# Patient Record
Sex: Female | Born: 1980 | Race: Black or African American | Hispanic: No | Marital: Single | Smoking: Never smoker
Health system: Southern US, Community
[De-identification: ages and names within clinical notes are randomized; demographics above are authoritative.]

## PROBLEM LIST (undated history)

## (undated) ENCOUNTER — Inpatient Hospital Stay (HOSPITAL_COMMUNITY): Payer: Self-pay

## (undated) DIAGNOSIS — J45909 Unspecified asthma, uncomplicated: Secondary | ICD-10-CM

## (undated) DIAGNOSIS — K589 Irritable bowel syndrome without diarrhea: Secondary | ICD-10-CM

## (undated) HISTORY — PX: FRACTURE SURGERY: SHX138

---

## 2002-09-13 ENCOUNTER — Emergency Department (HOSPITAL_COMMUNITY): Admission: EM | Admit: 2002-09-13 | Discharge: 2002-09-13 | Payer: Self-pay | Admitting: Emergency Medicine

## 2002-09-24 ENCOUNTER — Emergency Department (HOSPITAL_COMMUNITY): Admission: EM | Admit: 2002-09-24 | Discharge: 2002-09-24 | Payer: Self-pay | Admitting: *Deleted

## 2003-05-12 ENCOUNTER — Emergency Department (HOSPITAL_COMMUNITY): Admission: EM | Admit: 2003-05-12 | Discharge: 2003-05-12 | Payer: Self-pay | Admitting: Emergency Medicine

## 2004-08-22 ENCOUNTER — Emergency Department (HOSPITAL_COMMUNITY): Admission: EM | Admit: 2004-08-22 | Discharge: 2004-08-22 | Payer: Self-pay | Admitting: Family Medicine

## 2005-12-12 ENCOUNTER — Emergency Department (HOSPITAL_COMMUNITY): Admission: EM | Admit: 2005-12-12 | Discharge: 2005-12-12 | Payer: Self-pay | Admitting: Emergency Medicine

## 2007-09-08 ENCOUNTER — Inpatient Hospital Stay (HOSPITAL_COMMUNITY): Admission: AD | Admit: 2007-09-08 | Discharge: 2007-09-08 | Payer: Self-pay | Admitting: Obstetrics & Gynecology

## 2007-11-14 ENCOUNTER — Emergency Department (HOSPITAL_COMMUNITY): Admission: EM | Admit: 2007-11-14 | Discharge: 2007-11-14 | Payer: Self-pay | Admitting: Family Medicine

## 2009-02-15 ENCOUNTER — Inpatient Hospital Stay (HOSPITAL_COMMUNITY): Admission: AD | Admit: 2009-02-15 | Discharge: 2009-02-16 | Payer: Self-pay | Admitting: Obstetrics and Gynecology

## 2010-06-06 ENCOUNTER — Emergency Department (HOSPITAL_COMMUNITY): Admission: EM | Admit: 2010-06-06 | Discharge: 2010-04-06 | Payer: Self-pay | Admitting: Emergency Medicine

## 2010-09-11 LAB — RAPID STREP SCREEN (MED CTR MEBANE ONLY): Streptococcus, Group A Screen (Direct): POSITIVE — AB

## 2010-10-05 LAB — URINALYSIS, ROUTINE W REFLEX MICROSCOPIC
Bilirubin Urine: NEGATIVE
Glucose, UA: NEGATIVE mg/dL
Hgb urine dipstick: NEGATIVE
Ketones, ur: 15 mg/dL — AB
Nitrite: NEGATIVE
Protein, ur: NEGATIVE mg/dL
Specific Gravity, Urine: >1.03 — ABNORMAL HIGH (ref 1.005–1.030)
Urobilinogen, UA: 0.2 mg/dL (ref 0.0–1.0)
pH: 6 (ref 5.0–8.0)

## 2010-10-05 LAB — POCT PREGNANCY, URINE: Preg Test, Ur: POSITIVE

## 2010-10-05 LAB — HCG, QUANTITATIVE, PREGNANCY: hCG, Beta Chain, Quant, S: 46393 m[IU]/mL — ABNORMAL HIGH (ref <5)

## 2010-10-05 LAB — ABO/RH: ABO/RH(D): O POS

## 2010-10-15 ENCOUNTER — Emergency Department (HOSPITAL_COMMUNITY)
Admission: EM | Admit: 2010-10-15 | Discharge: 2010-10-15 | Disposition: A | Discharge status: Home / Self Care | Payer: Self-pay | Attending: Emergency Medicine | Admitting: Emergency Medicine

## 2010-10-15 DIAGNOSIS — R21 Rash and other nonspecific skin eruption: Secondary | ICD-10-CM | POA: Insufficient documentation

## 2010-10-15 DIAGNOSIS — J45909 Unspecified asthma, uncomplicated: Secondary | ICD-10-CM | POA: Insufficient documentation

## 2010-10-15 DIAGNOSIS — L299 Pruritus, unspecified: Secondary | ICD-10-CM | POA: Insufficient documentation

## 2010-10-15 DIAGNOSIS — J020 Streptococcal pharyngitis: Secondary | ICD-10-CM | POA: Insufficient documentation

## 2010-10-15 LAB — RAPID STREP SCREEN (MED CTR MEBANE ONLY): Streptococcus, Group A Screen (Direct): POSITIVE — AB

## 2010-12-13 ENCOUNTER — Inpatient Hospital Stay (HOSPITAL_COMMUNITY)
Admission: AD | Admit: 2010-12-13 | Discharge: 2010-12-14 | Disposition: A | Discharge status: Home / Self Care | Payer: Self-pay | Source: Ambulatory Visit | Attending: Obstetrics and Gynecology | Admitting: Obstetrics and Gynecology

## 2010-12-13 DIAGNOSIS — N949 Unspecified condition associated with female genital organs and menstrual cycle: Secondary | ICD-10-CM | POA: Insufficient documentation

## 2010-12-13 DIAGNOSIS — N938 Other specified abnormal uterine and vaginal bleeding: Secondary | ICD-10-CM | POA: Insufficient documentation

## 2010-12-13 DIAGNOSIS — D259 Leiomyoma of uterus, unspecified: Secondary | ICD-10-CM | POA: Insufficient documentation

## 2010-12-13 LAB — CBC
HCT: 35.3 % — ABNORMAL LOW (ref 36.0–46.0)
Hemoglobin: 11.3 g/dL — ABNORMAL LOW (ref 12.0–15.0)
MCH: 25.8 pg — ABNORMAL LOW (ref 26.0–34.0)
MCHC: 32 g/dL (ref 30.0–36.0)
MCV: 80.6 fL (ref 78.0–100.0)
Platelets: 268 10*3/uL (ref 150–400)
RBC: 4.38 MIL/uL (ref 3.87–5.11)
RDW: 15.5 % (ref 11.5–15.5)
WBC: 12.3 10*3/uL — ABNORMAL HIGH (ref 4.0–10.5)

## 2010-12-14 ENCOUNTER — Inpatient Hospital Stay (HOSPITAL_COMMUNITY): Payer: Self-pay

## 2010-12-14 LAB — URINALYSIS, ROUTINE W REFLEX MICROSCOPIC
Bilirubin Urine: NEGATIVE
Glucose, UA: NEGATIVE mg/dL
Hgb urine dipstick: NEGATIVE
Ketones, ur: NEGATIVE mg/dL
Leukocytes, UA: NEGATIVE
Nitrite: NEGATIVE
Protein, ur: NEGATIVE mg/dL
Specific Gravity, Urine: >1.03 — ABNORMAL HIGH (ref 1.005–1.030)
Urobilinogen, UA: 0.2 mg/dL (ref 0.0–1.0)
pH: 5.5 (ref 5.0–8.0)

## 2010-12-14 LAB — WET PREP, GENITAL
Clue Cells Wet Prep HPF POC: NONE SEEN
Trich, Wet Prep: NONE SEEN
WBC, Wet Prep HPF POC: NONE SEEN
Yeast Wet Prep HPF POC: NONE SEEN

## 2010-12-14 LAB — POCT PREGNANCY, URINE: Preg Test, Ur: NEGATIVE

## 2010-12-14 LAB — GC/CHLAMYDIA PROBE AMP, GENITAL
Chlamydia, DNA Probe: NEGATIVE
GC Probe Amp, Genital: NEGATIVE

## 2011-01-24 ENCOUNTER — Ambulatory Visit: Payer: Self-pay | Admitting: Occupational Therapy

## 2011-01-24 ENCOUNTER — Ambulatory Visit: Payer: Self-pay | Admitting: Advanced Practice Midwife

## 2011-03-24 LAB — URINALYSIS, ROUTINE W REFLEX MICROSCOPIC
Bilirubin Urine: NEGATIVE
Glucose, UA: NEGATIVE
Hgb urine dipstick: NEGATIVE
Ketones, ur: NEGATIVE
Nitrite: NEGATIVE
Protein, ur: NEGATIVE
Specific Gravity, Urine: 1.02
Urobilinogen, UA: 0.2
pH: 5.5

## 2011-03-24 LAB — WET PREP, GENITAL
Trich, Wet Prep: NONE SEEN
Yeast Wet Prep HPF POC: NONE SEEN

## 2011-03-24 LAB — GC/CHLAMYDIA PROBE AMP, GENITAL
Chlamydia, DNA Probe: NEGATIVE
GC Probe Amp, Genital: NEGATIVE

## 2011-03-24 LAB — POCT PREGNANCY, URINE
Operator id: 22373
Preg Test, Ur: NEGATIVE

## 2011-03-26 LAB — POCT PREGNANCY, URINE
Operator id: 282151
Preg Test, Ur: NEGATIVE

## 2011-11-29 ENCOUNTER — Encounter (HOSPITAL_COMMUNITY): Payer: Self-pay | Admitting: *Deleted

## 2011-11-29 ENCOUNTER — Emergency Department (HOSPITAL_COMMUNITY)
Admission: EM | Admit: 2011-11-29 | Discharge: 2011-11-29 | Disposition: A | Discharge status: Home / Self Care | Payer: Self-pay | Attending: Emergency Medicine | Admitting: Emergency Medicine

## 2011-11-29 DIAGNOSIS — R112 Nausea with vomiting, unspecified: Secondary | ICD-10-CM | POA: Insufficient documentation

## 2011-11-29 DIAGNOSIS — R42 Dizziness and giddiness: Secondary | ICD-10-CM | POA: Insufficient documentation

## 2011-11-29 DIAGNOSIS — R51 Headache: Secondary | ICD-10-CM | POA: Insufficient documentation

## 2011-11-29 LAB — URINE MICROSCOPIC-ADD ON

## 2011-11-29 LAB — URINALYSIS, ROUTINE W REFLEX MICROSCOPIC
Nitrite: NEGATIVE
Specific Gravity, Urine: 1.034 — ABNORMAL HIGH (ref 1.005–1.030)
Urobilinogen, UA: 0.2 mg/dL (ref 0.0–1.0)
pH: 6 (ref 5.0–8.0)

## 2011-11-29 LAB — POCT PREGNANCY, URINE: Preg Test, Ur: NEGATIVE

## 2011-11-29 MED ORDER — MECLIZINE HCL 50 MG PO TABS: 50.0000 mg | ORAL_TABLET | Freq: Three times a day (TID) | ORAL | Status: AC | PRN

## 2011-11-29 MED ORDER — MECLIZINE HCL 25 MG PO TABS
50.0000 mg | ORAL_TABLET | Freq: Once | ORAL | Status: AC
  Administered 2011-11-29: 50 mg via ORAL
  Filled 2011-11-29 (×2): qty 1

## 2011-11-29 NOTE — ED Notes (Signed)
Pt from home with reports of intermittent headache, dizziness, nausea and vomiting (caused by dizziness per pt) for 2-3 days. Pt reports an episode or dizziness upon standing about a month ago but subsided.

## 2011-11-29 NOTE — ED Provider Notes (Signed)
History     CSN: [id number]  Arrival date & time 11/29/11  1454   First MD Initiated Contact with Patient 11/29/11 1522      Chief Complaint  Patient presents with  . Dizziness  . Headache  . Nausea  . Emesis    (Consider location/radiation/quality/duration/timing/severity/associated sxs/prior treatment) Patient is a 31 y.o. female presenting with headaches and vomiting. The history is provided by the patient.  Headache  Associated symptoms include vomiting.  Emesis  Associated symptoms include headaches.   patient here with dizziness with emesis x1. Symptoms worse with sudden movements of her head. Does note recent sinus pain and drainage from seasonal allergies. Denies any ataxia or peripheral weakness. No diplopia. No prior history of same. No medications taken for this prior to arrival. Denies any neck pain or palpitations.  History reviewed. No pertinent past medical history.  Past Surgical History  Procedure Date  . Fracture surgery     History reviewed. No pertinent family history.  History  Substance Use Topics  . Smoking status: Never Smoker   . Smokeless tobacco: Never Used  . Alcohol Use: Yes     occ    OB History    Grav Para Term Preterm Abortions TAB SAB Ect Mult Living                  Review of Systems  Gastrointestinal: Positive for vomiting.  Neurological: Positive for headaches.  All other systems reviewed and are negative.    Allergies  Amoxicillin  Home Medications   Current Outpatient Rx  Name Route Sig Dispense Refill  . IBUPROFEN 200 MG PO TABS Oral Take 200 mg by mouth every 6 (six) hours as needed. Pain      BP 136/83  Pulse 72  Temp(Src) 98.5 F (36.9 C) (Oral)  Resp 19  Wt 234 lb (106.142 kg)  SpO2 100%  LMP 11/28/2011  Physical Exam  Nursing note and vitals reviewed. Constitutional: She is oriented to person, place, and time. She appears well-developed and well-nourished.  Non-toxic appearance. No distress.    HENT:  Head: Normocephalic and atraumatic.  Eyes: Conjunctivae, EOM and lids are normal. Pupils are equal, round, and reactive to light.  Neck: Normal range of motion. Neck supple. No tracheal deviation present. No mass present.  Cardiovascular: Normal rate, regular rhythm and normal heart sounds.  Exam reveals no gallop.   No murmur heard. Pulmonary/Chest: Effort normal and breath sounds normal. No stridor. No respiratory distress. She has no decreased breath sounds. She has no wheezes. She has no rhonchi. She has no rales.  Abdominal: Soft. Normal appearance and bowel sounds are normal. She exhibits no distension. There is no tenderness. There is no rebound and no CVA tenderness.  Musculoskeletal: Normal range of motion. She exhibits no edema and no tenderness.  Neurological: She is alert and oriented to person, place, and time. She has normal strength. No cranial nerve deficit or sensory deficit. She displays a negative Romberg sign. Coordination normal. GCS eye subscore is 4. GCS verbal subscore is 5. GCS motor subscore is 6.  Skin: Skin is warm and dry. No abrasion and no rash noted.  Psychiatric: She has a normal mood and affect. Her speech is normal and behavior is normal.    ED Course  Procedures (including critical care time)   Labs Reviewed  URINALYSIS, ROUTINE W REFLEX MICROSCOPIC   No results found.   No diagnosis found.    MDM  Pt given antivert and  feels better--suspect bpv and not central vertigo--stable for d/c        Toy Baker, MD 11/29/11 [phone]

## 2011-11-29 NOTE — Discharge Instructions (Signed)
Benign Positional Vertigo Vertigo means you feel like you or your surroundings are moving when they are not. Benign positional vertigo is the most common form of vertigo. Benign means that the cause of your condition is not serious. Benign positional vertigo is more common in older adults. CAUSES  Benign positional vertigo is the result of an upset in the labyrinth system. This is an area in the middle ear that helps control your balance. This may be caused by a viral infection, head injury, or repetitive motion. However, often no specific cause is found. SYMPTOMS  Symptoms of benign positional vertigo occur when you move your head or eyes in different directions. Some of the symptoms may include:  Loss of balance and falls.   Vomiting.   Blurred vision.   Dizziness.   Nausea.   Involuntary eye movements (nystagmus).  DIAGNOSIS  Benign positional vertigo is usually diagnosed by physical exam. If the specific cause of your benign positional vertigo is unknown, your caregiver may perform imaging tests, such as magnetic resonance imaging (MRI) or computed tomography (CT). TREATMENT  Your caregiver may recommend movements or procedures to correct the benign positional vertigo. Medicines such as meclizine, benzodiazepines, and medicines for nausea may be used to treat your symptoms. In rare cases, if your symptoms are caused by certain conditions that affect the inner ear, you may need surgery. HOME CARE INSTRUCTIONS   Follow your caregiver's instructions.   Move slowly. Do not make sudden body or head movements.   Avoid driving.   Avoid operating heavy machinery.   Avoid performing any tasks that would be dangerous to you or others during a vertigo episode.   Drink enough fluids to keep your urine clear or pale yellow.  SEEK IMMEDIATE MEDICAL CARE IF:   You develop problems with walking, weakness, numbness, or using your arms, hands, or legs.   You have difficulty speaking.   You  develop severe headaches.   Your nausea or vomiting continues or gets worse.   You develop visual changes.   Your family or friends notice any behavioral changes.   Your condition gets worse.   You have a fever.   You develop a stiff neck or sensitivity to light.  MAKE SURE YOU:   Understand these instructions.   Will watch your condition.   Will get help right away if you are not doing well or get worse.  Document Released: 03/24/2006 Document Revised: 06/05/2011 Document Reviewed: 03/06/2011 [employer] Patient Information 2012 [employer].Dizziness Dizziness is a common problem. It is a feeling of unsteadiness or lightheadedness. You may feel like you are about to faint. Dizziness can lead to injury if you stumble or fall. A person of any age group can suffer from dizziness, but dizziness is more common in older adults. CAUSES  Dizziness can be caused by many different things, including:  Middle ear problems.   Standing for too long.   Infections.   An allergic reaction.   Aging.   An emotional response to something, such as the sight of blood.   Side effects of medicines.   Fatigue.   Problems with circulation or blood pressure.   Excess use of alcohol, medicines, or illegal drug use.   Breathing too fast (hyperventilation).   An arrhythmia or problems with your heart rhythm.   Low red blood cell count (anemia).   Pregnancy.   Vomiting, diarrhea, fever, or other illnesses that cause dehydration.   Diseases or conditions such as Parkinson's disease, high blood   pressure (hypertension), diabetes, and thyroid problems.   Exposure to extreme heat.  DIAGNOSIS  To find the cause of your dizziness, your caregiver may do a physical exam, lab tests, radiologic imaging scans, or an electrocardiography test (ECG).  TREATMENT  Treatment of dizziness depends on the cause of your symptoms and can vary greatly. HOME CARE INSTRUCTIONS   Drink enough fluids to keep  your urine clear or pale yellow. This is especially important in very hot weather. In the elderly, it is also important in cold weather.   If your dizziness is caused by medicines, take them exactly as directed. When taking blood pressure medicines, it is especially important to get up slowly.   Rise slowly from chairs and steady yourself until you feel okay.   In the morning, first sit up on the side of the bed. When this seems okay, stand slowly while holding onto something until you know your balance is fine.   If you need to stand in one place for a long time, be sure to move your legs often. Tighten and relax the muscles in your legs while standing.   If dizziness continues to be a problem, have someone stay with you for a day or two. Do this until you feel you are well enough to stay alone. Have the person call your caregiver if he or she notices changes in you that are concerning.   Do not drive or use heavy machinery if you feel dizzy.  SEEK IMMEDIATE MEDICAL CARE IF:   Your dizziness or lightheadedness gets worse.   You feel nauseous or vomit.   You develop problems with talking, walking, weakness, or using your arms, hands, or legs.   You are not thinking clearly or you have difficulty forming sentences. It may take a friend or family member to determine if your thinking is normal.   You develop chest pain, abdominal pain, shortness of breath, or sweating.   Your vision changes.   You notice any bleeding.   You have side effects from medicine that seems to be getting worse rather than better.  MAKE SURE YOU:   Understand these instructions.   Will watch your condition.   Will get help right away if you are not doing well or get worse.  Document Released: 12/10/2000 Document Revised: 06/05/2011 Document Reviewed: 01/03/2011 [employer] Patient Information 2012 [employer]. 

## 2012-02-26 ENCOUNTER — Encounter (HOSPITAL_COMMUNITY): Payer: Self-pay | Admitting: Emergency Medicine

## 2012-02-26 ENCOUNTER — Emergency Department (HOSPITAL_COMMUNITY)
Admission: EM | Admit: 2012-02-26 | Discharge: 2012-02-26 | Disposition: A | Discharge status: Home / Self Care | Payer: Self-pay | Attending: Emergency Medicine | Admitting: Emergency Medicine

## 2012-02-26 DIAGNOSIS — L0291 Cutaneous abscess, unspecified: Secondary | ICD-10-CM

## 2012-02-26 DIAGNOSIS — J45909 Unspecified asthma, uncomplicated: Secondary | ICD-10-CM | POA: Insufficient documentation

## 2012-02-26 DIAGNOSIS — IMO0002 Reserved for concepts with insufficient information to code with codable children: Secondary | ICD-10-CM | POA: Insufficient documentation

## 2012-02-26 HISTORY — DX: Unspecified asthma, uncomplicated: J45.909

## 2012-02-26 MED ORDER — SULFAMETHOXAZOLE-TRIMETHOPRIM 800-160 MG PO TABS: 1.0000 | ORAL_TABLET | Freq: Two times a day (BID) | ORAL | Status: AC

## 2012-02-26 NOTE — ED Notes (Signed)
Pt c/o boil in R axilla area. Pt states she keeps draining it, but keeps coming back.

## 2012-02-26 NOTE — ED Provider Notes (Signed)
History     CSN: [id number]  Arrival date & time 02/26/12  1826   First MD Initiated Contact with Patient 02/26/12 1924      Chief Complaint  Patient presents with  . Skin Abcess     (Consider location/radiation/quality/duration/timing/severity/associated sxs/prior treatment) HPI Comments: Kaitlin Salinas 31 y.o. female   The chief complaint is: Patient presents with:   Skin Abcess    The patient has medical history significant for:   Past Medical History:   Asthma                                                      Patient presents with abscess under her left arm. She states that it has been there for a few months and that she has this problem chronically. She has used tea tree oil and warm compresses to drain it. The area temporarily resolves but then returns. Denies fever or chills. Denies NVD or abdominal pain.     The history is provided by the patient.    Past Medical History  Diagnosis Date  . Asthma     Past Surgical History  Procedure Date  . Fracture surgery     No family history on file.  History  Substance Use Topics  . Smoking status: Never Smoker   . Smokeless tobacco: Never Used  . Alcohol Use: Yes     occ    OB History    Grav Para Term Preterm Abortions TAB SAB Ect Mult Living                  Review of Systems  Constitutional: Negative for fever and chills.  Gastrointestinal: Positive for abdominal pain. Negative for nausea, vomiting and diarrhea.  Skin: Positive for color change.  All other systems reviewed and are negative.    Allergies  Amoxicillin  Home Medications   Current Outpatient Rx  Name Route Sig Dispense Refill  . ALBUTEROL SULFATE HFA 108 (90 BASE) MCG/ACT IN AERS Inhalation Inhale 2 puffs into the lungs every 6 (six) hours as needed. SHORTNESS OF BREATH    . IBUPROFEN 200 MG PO TABS Oral Take 200 mg by mouth every 6 (six) hours as needed. Pain    . TEA TREE OIL EX Apply externally Apply 1 application  topically daily.      BP 140/94  Pulse 79  Temp 98.7 F (37.1 C) (Oral)  Resp 18  SpO2 99%  LMP 01/26/2012  Physical Exam  Nursing note and vitals reviewed. Constitutional: She appears well-developed and well-nourished.  HENT:  Head: Normocephalic and atraumatic.  Mouth/Throat: Oropharynx is clear and moist.  Eyes: Conjunctivae and EOM are normal. No scleral icterus.  Cardiovascular: Normal rate, regular rhythm and normal heart sounds.   Pulmonary/Chest: Effort normal and breath sounds normal.  Abdominal: Soft. Bowel sounds are normal. There is no tenderness.  Neurological: She is alert.  Skin: Skin is warm and dry.       1cm abscess located under right axilla. Visible pus.    ED Course  Procedures (including critical care time)  Labs Reviewed - No data to display No results found. INCISION AND DRAINAGE Performed by: Pixie Casino Consent: Verbal consent obtained. Risks and benefits: risks, benefits and alternatives were discussed Type: abscess  Body area: right axilla  Anesthesia: local infiltration  Local  anesthetic: lidocaine 1%   Anesthetic total: 2 ml  Complexity: complex Blunt dissection to break up loculations  Drainage: purulent  Drainage amount: 4ml  Packing material: 1/4 in iodoform gauze  Patient tolerance: Patient tolerated the procedure well with no immediate complications.     1. Abscess       MDM  Patient presented with abscess of the right axilla. I&D performed successfully without complication. Patient declined pain medication. Patient instructed to remove packing in two days. Discharged on antibiotics with appropriate return precautions. No red flags for cellulitis.        Pixie Casino, PA-C 02/26/12 2315

## 2012-02-27 NOTE — ED Provider Notes (Signed)
Medical screening examination/treatment/procedure(s) were performed by non-physician practitioner and as supervising physician I was immediately available for consultation/collaboration.  Martha K Linker, MD 02/27/12 0035 

## 2014-06-30 DIAGNOSIS — K589 Irritable bowel syndrome without diarrhea: Secondary | ICD-10-CM

## 2014-06-30 HISTORY — DX: Irritable bowel syndrome, unspecified: K58.9

## 2014-09-17 ENCOUNTER — Emergency Department (HOSPITAL_COMMUNITY)
Admission: EM | Admit: 2014-09-17 | Discharge: 2014-09-17 | Disposition: A | Discharge status: Home / Self Care | Payer: 59 | Attending: Emergency Medicine | Admitting: Emergency Medicine

## 2014-09-17 ENCOUNTER — Encounter (HOSPITAL_COMMUNITY): Payer: Self-pay | Admitting: *Deleted

## 2014-09-17 DIAGNOSIS — K029 Dental caries, unspecified: Secondary | ICD-10-CM

## 2014-09-17 DIAGNOSIS — J45909 Unspecified asthma, uncomplicated: Secondary | ICD-10-CM | POA: Diagnosis not present

## 2014-09-17 DIAGNOSIS — Z79899 Other long term (current) drug therapy: Secondary | ICD-10-CM | POA: Diagnosis not present

## 2014-09-17 DIAGNOSIS — Z880 Allergy status to penicillin: Secondary | ICD-10-CM | POA: Diagnosis not present

## 2014-09-17 DIAGNOSIS — K088 Other specified disorders of teeth and supporting structures: Secondary | ICD-10-CM | POA: Diagnosis present

## 2014-09-17 NOTE — ED Notes (Signed)
Patient with c/o tooth pain x 3-4 months Patient states that Motrin provides relief, but pain returns when medication wears off No facial edema noted  Patient able to speak in full, complete sentences without difficulty--handles secretions Patient in NAD

## 2014-09-17 NOTE — Discharge Instructions (Signed)
Dental Pain A tooth ache may be caused by cavities (tooth decay). Cavities expose the nerve of the tooth to air and hot or cold temperatures. It may come from an infection or abscess (also called a boil or furuncle) around your tooth. It is also often caused by dental caries (tooth decay). This causes the pain you are having. DIAGNOSIS  Your caregiver can diagnose this problem by exam. TREATMENT   If caused by an infection, it may be treated with medications which kill germs (antibiotics) and pain medications as prescribed by your caregiver. Take medications as directed.  Only take over-the-counter or prescription medicines for pain, discomfort, or fever as directed by your caregiver.  Whether the tooth ache today is caused by infection or dental disease, you should see your dentist as soon as possible for further care. SEEK MEDICAL CARE IF: The exam and treatment you received today has been provided on an emergency basis only. This is not a substitute for complete medical or dental care. If your problem worsens or new problems (symptoms) appear, and you are unable to meet with your dentist, call or return to this location. SEEK IMMEDIATE MEDICAL CARE IF:   You have a fever.  You develop redness and swelling of your face, jaw, or neck.  You are unable to open your mouth.  You have severe pain uncontrolled by pain medicine. MAKE SURE YOU:   Understand these instructions.  Will watch your condition.  Will get help right away if you are not doing well or get worse. Document Released: 06/16/2005 Document Revised: 09/08/2011 Document Reviewed: 02/02/2008 [facility] Patient Information 2015 [place], [place]. This information is not intended to replace advice given to you by your health care provider. Make sure you discuss any questions you have with your health care provider.  Dental Caries Dental caries is tooth decay. This decay can cause a hole in teeth (cavity) that can get bigger and  deeper over time. HOME CARE  Brush and floss your teeth. Do this at least two times a day.  Use a fluoride toothpaste.  Use a mouth rinse if told by your dentist or doctor.  Eat less sugary and starchy foods. Drink less sugary drinks.  Avoid snacking often on sugary and starchy foods. Avoid sipping often on sugary drinks.  Keep regular checkups and cleanings with your dentist.  Use fluoride supplements if told by your dentist or doctor.  Allow fluoride to be applied to teeth if told by your dentist or doctor. Document Released: 03/25/2008 Document Revised: 10/31/2013 Document Reviewed: 06/18/2012 [facility] Patient Information 2015 [place], [place]. This information is not intended to replace advice given to you by your health care provider. Make sure you discuss any questions you have with your health care provider. Please go to the drugstore and by either dental wax, or temporary filling to place in the cavity.  You've also been given a referral to the dentist on call.  Please call first thing in the morning telling when you were referred through the emergency department.  They will make every effort to see you within a 24-hour window

## 2014-09-17 NOTE — ED Notes (Signed)
Patient asking for clarification r/t dentist referral NP at bedside

## 2014-09-17 NOTE — ED Provider Notes (Signed)
CSN: [id number]     Arrival date & time 09/17/14  2036 History   None    This chart was scribed for non-physician practitioner, Junius Creamer, [place] working with Lajean Saver, MD by Forrestine Him, ED Scribe. This patient was seen in room WTR8/WTR8 and the patient's care was started at 8:55 PM.   Chief Complaint  Patient presents with  . Dental Pain   The history is provided by the patient. No language interpreter was used.    HPI Comments: Kaitlin Salinas is a 34 y.o. female with a PMHx of Asthma who presents to the Emergency Department complaining of denital pain x 2 months ago. Pt states she is unable to eat without any discomfort. Pain is worsened with cold sensations and when eating sweets. She has tried OTC Orajel and Ibuprofren with temporary improvement for symptoms. No recent fever or chills. [name] is not currently followed by a dentist. Pt with known allergy to Amoxicillin.  Past Medical History  Diagnosis Date  . Asthma    Past Surgical History  Procedure Laterality Date  . Fracture surgery     History reviewed. No pertinent family history. History  Substance Use Topics  . Smoking status: Never Smoker   . Smokeless tobacco: Never Used  . Alcohol Use: Yes     Comment: occ   OB History    No data available     Review of Systems  Constitutional: Negative for fever and chills.  HENT: Positive for dental problem.       Allergies  Amoxicillin  Home Medications   Prior to Admission medications   Medication Sig Start Date End Date Taking? Authorizing Provider  albuterol (PROVENTIL HFA;VENTOLIN HFA) 108 (90 BASE) MCG/ACT inhaler Inhale 2 puffs into the lungs every 6 (six) hours as needed. SHORTNESS OF BREATH    Historical Provider, MD  ibuprofen (ADVIL,MOTRIN) 200 MG tablet Take 200 mg by mouth every 6 (six) hours as needed. Pain    Historical Provider, MD  TEA TREE OIL EX Apply 1 application topically daily.    Historical Provider, MD   Triage Vitals: BP 157/97  mmHg  Pulse 62  Temp(Src) 97.8 F (36.6 C) (Oral)  Resp 22  SpO2 100%   Physical Exam  Constitutional: She is oriented to person, place, and time. She appears well-developed and well-nourished.  HENT:  Head: Normocephalic.  Mouth/Throat:    Eyes: EOM are normal.  Neck: Normal range of motion.  Pulmonary/Chest: Effort normal.  Abdominal: She exhibits no distension.  Musculoskeletal: Normal range of motion.  Neurological: She is alert and oriented to person, place, and time.  Psychiatric: She has a normal mood and affect.  Nursing note and vitals reviewed.   ED Course  Procedures (including critical care time)  DIAGNOSTIC STUDIES: Oxygen Saturation is 100% on RA, Normal by my interpretation.    COORDINATION OF CARE: 9:13 PM-Discussed treatment plan with pt at bedside and pt agreed to plan.  a   Labs Review Labs Reviewed - No data to display  Imaging Review No results found.   EKG Interpretation None      MDM   Final diagnoses:  Dental cavity   I personally performed the services described in this documentation, which was scribed in my presence. The recorded information has been reviewed and is accurate.  Junius Creamer, NP 09/17/14 2112  Junius Creamer, NP 09/17/14 2113  Lajean Saver, MD 09/19/14 [phone]

## 2015-01-16 ENCOUNTER — Emergency Department (HOSPITAL_COMMUNITY)
Admission: EM | Admit: 2015-01-16 | Discharge: 2015-01-16 | Disposition: A | Discharge status: Home / Self Care | Payer: 59 | Attending: Emergency Medicine | Admitting: Emergency Medicine

## 2015-01-16 ENCOUNTER — Encounter (HOSPITAL_COMMUNITY): Payer: Self-pay | Admitting: *Deleted

## 2015-01-16 DIAGNOSIS — Z880 Allergy status to penicillin: Secondary | ICD-10-CM | POA: Diagnosis not present

## 2015-01-16 DIAGNOSIS — H109 Unspecified conjunctivitis: Secondary | ICD-10-CM

## 2015-01-16 DIAGNOSIS — Z79899 Other long term (current) drug therapy: Secondary | ICD-10-CM | POA: Insufficient documentation

## 2015-01-16 DIAGNOSIS — J45909 Unspecified asthma, uncomplicated: Secondary | ICD-10-CM | POA: Diagnosis not present

## 2015-01-16 DIAGNOSIS — L02411 Cutaneous abscess of right axilla: Secondary | ICD-10-CM

## 2015-01-16 MED ORDER — ACIDOPHILUS PROBIOTIC 10 MG PO TABS: 10.0000 mg | ORAL_TABLET | Freq: Three times a day (TID) | ORAL | Status: DC

## 2015-01-16 MED ORDER — CLINDAMYCIN HCL 150 MG PO CAPS
300.0000 mg | ORAL_CAPSULE | Freq: Three times a day (TID) | ORAL | Status: DC
Start: 2015-01-16 — End: 2015-05-04

## 2015-01-16 MED ORDER — POLYMYXIN B-TRIMETHOPRIM 10000-0.1 UNIT/ML-% OP SOLN: 1.0000 [drp] | OPHTHALMIC | Status: DC

## 2015-01-16 MED ORDER — LIDOCAINE HCL (PF) 1 % IJ SOLN
5.0000 mL | Freq: Once | INTRAMUSCULAR | Status: AC
  Administered 2015-01-16: 5 mL
  Filled 2015-01-16: qty 5

## 2015-01-16 NOTE — Discharge Instructions (Signed)
Abscess °An abscess is an infected area that contains a collection of pus and debris. It can occur in almost any part of the body. An abscess is also known as a furuncle or boil. °CAUSES  °An abscess occurs when tissue gets infected. This can occur from blockage of oil or sweat glands, infection of hair follicles, or a minor injury to the skin. As the body tries to fight the infection, pus collects in the area and creates pressure under the skin. This pressure causes pain. People with weakened immune systems have difficulty fighting infections and get certain abscesses more often.  °SYMPTOMS °Usually an abscess develops on the skin and becomes a painful mass that is red, warm, and tender. If the abscess forms under the skin, you may feel a moveable soft area under the skin. Some abscesses break open (rupture) on their own, but most will continue to get worse without care. The infection can spread deeper into the body and eventually into the bloodstream, causing you to feel ill.  °DIAGNOSIS  °Your caregiver will take your medical history and perform a physical exam. A sample of fluid may also be taken from the abscess to determine what is causing your infection. °TREATMENT  °Your caregiver may prescribe antibiotic medicines to fight the infection. However, taking antibiotics alone usually does not cure an abscess. Your caregiver may need to make a small cut (incision) in the abscess to drain the pus. In some cases, gauze is packed into the abscess to reduce pain and to continue draining the area. °HOME CARE INSTRUCTIONS  °· Only take over-the-counter or prescription medicines for pain, discomfort, or fever as directed by your caregiver. °· If you were prescribed antibiotics, take them as directed. Finish them even if you start to feel better. °· If gauze is used, follow your caregiver's directions for changing the gauze. °· To avoid spreading the infection: °· Keep your draining abscess covered with a  bandage. °· Wash your hands well. °· Do not share personal care items, towels, or whirlpools with others. °· Avoid skin contact with others. °· Keep your skin and clothes clean around the abscess. °· Keep all follow-up appointments as directed by your caregiver. °SEEK MEDICAL CARE IF:  °· You have increased pain, swelling, redness, fluid drainage, or bleeding. °· You have muscle aches, chills, or a general ill feeling. °· You have a fever. °MAKE SURE YOU:  °· Understand these instructions. °· Will watch your condition. °· Will get help right away if you are not doing well or get worse. °Document Released: 03/26/2005 Document Revised: 12/16/2011 Document Reviewed: 08/29/2011 °Exit[employer]® Patient Information ©2015 [employer]. This information is not intended to replace advice given to you by your health care provider. Make sure you discuss any questions you have with your health care provider. ° °Abscess °Care After °An abscess (also called a boil or furuncle) is an infected area that contains a collection of pus. Signs and symptoms of an abscess include pain, tenderness, redness, or hardness, or you may feel a moveable soft area under your skin. An abscess can occur anywhere in the body. The infection may spread to surrounding tissues causing cellulitis. A cut (incision) by the surgeon was made over your abscess and the pus was drained out. Gauze may have been packed into the space to provide a drain that will allow the cavity to heal from the inside outwards. The boil may be painful for 5 to 7 days. Most people with a boil do not have   high fevers. Your abscess, if seen early, may not have localized, and may not have been lanced. If not, another appointment may be required for this if it does not get better on its own or with medications. HOME CARE INSTRUCTIONS   Only take over-the-counter or prescription medicines for pain, discomfort, or fever as directed by your caregiver.  When you bathe, soak and then  remove gauze or iodoform packs at least daily or as directed by your caregiver. You may then wash the wound gently with mild soapy water. Repack with gauze or do as your caregiver directs. SEEK IMMEDIATE MEDICAL CARE IF:   You develop increased pain, swelling, redness, drainage, or bleeding in the wound site.  You develop signs of generalized infection including muscle aches, chills, fever, or a general ill feeling.  An oral temperature above 102 F (38.9 C) develops, not controlled by medication. See your caregiver for a recheck if you develop any of the symptoms described above. If medications (antibiotics) were prescribed, take them as directed. Document Released: 01/02/2005 Document Revised: 09/08/2011 Document Reviewed: 08/30/2007 [facility] Patient Information 2015 [place], [place]. This information is not intended to replace advice given to you by your health care provider. Make sure you discuss any questions you have with your health care provider.  Bacterial Conjunctivitis Bacterial conjunctivitis (commonly called pink eye) is redness, soreness, or puffiness (inflammation) of the white part of your eye. It is caused by a germ called bacteria. These germs can easily spread from person to person (contagious). Your eye often will become red or pink. Your eye may also become irritated, watery, or have a thick discharge.  HOME CARE   Apply a cool, clean washcloth over closed eyelids. Do this for 10-20 minutes, 3-4 times a day while you have pain.  Gently wipe away any fluid coming from the eye with a warm, wet washcloth or cotton ball.  Wash your hands often with soap and water. Use paper towels to dry your hands.  Do not share towels or washcloths.  Change or wash your pillowcase every day.  Do not use eye makeup until the infection is gone.  Do not use machines or drive if your vision is blurry.  Stop using contact lenses. Do not use them again until your doctor says it is  okay.  Do not touch the tip of the eye drop bottle or medicine tube with your fingers when you put medicine on the eye. GET HELP RIGHT AWAY IF:   Your eye is not better after 3 days of starting your medicine.  You have a yellowish fluid coming out of the eye.  You have more pain in the eye.  Your eye redness is spreading.  Your vision becomes blurry.  You have a fever or lasting symptoms for more than 2-3 days.  You have a fever and your symptoms suddenly get worse.  You have pain in the face.  Your face gets red or puffy (swollen). MAKE SURE YOU:   Understand these instructions.  Will watch this condition.  Will get help right away if you are not doing well or get worse. Document Released: 03/25/2008 Document Revised: 06/02/2012 Document Reviewed: 02/20/2012 [facility] Patient Information 2015 [place], [place]. This information is not intended to replace advice given to you by your health care provider. Make sure you discuss any questions you have with your health care provider.

## 2015-01-16 NOTE — ED Notes (Signed)
Pt reports abscess in her R axilla x 2 weeks, no drainage noted at this time.  Pt also reports L eye redness and soreness since Sunday.

## 2015-01-16 NOTE — ED Provider Notes (Signed)
CSN: [id number]     Arrival date & time 01/16/15  1525 History   This chart was scribed for Kaitlin Pean, PA-C working with No att. providers found by Mercy Moore, ED Scribe. This patient was seen in room WTR7/WTR7 and the patient's care was started at 4:41 PM.   Chief Complaint  Patient presents with  . Abscess  . Eye Pain   The history is provided by the patient. No language interpreter was used.   HPI Comments: Kaitlin Salinas is a 34 y.o. female who presents to the Emergency Department complaining of localized pain, swelling, and warmth to right axilla for two weeks now. Patient reports history of right axillary abscess requiring incision. She denies drainage from the abscess. She denies fevers or chills.   Patient secondarily complains of pain and irritation to her left eyeball and eye redness ongoing for two days now. Patient reports treatment with eye drops which did work to relieve her pain and she feels her symptoms are improved. She noted matting discharge yesterday.  Patient does not wear contact lens or glasses.  Patient denies double vision or change in her vision since onset. She denies sick contacts. She denies foreign body sensation.   Past Medical History  Diagnosis Date  . Asthma    Past Surgical History  Procedure Laterality Date  . Fracture surgery     No family history on file. History  Substance Use Topics  . Smoking status: Never Smoker   . Smokeless tobacco: Never Used  . Alcohol Use: Yes     Comment: occ   OB History    No data available     Review of Systems  Constitutional: Negative for fever and chills.  HENT: Negative for ear pain, facial swelling, sore throat and trouble swallowing.   Eyes: Positive for pain, redness and itching. Negative for photophobia, discharge and visual disturbance.  Respiratory: Negative for cough.   Skin: Positive for color change. Negative for rash.       Abscess       Allergies  Amoxicillin  Home Medications    Prior to Admission medications   Medication Sig Start Date End Date Taking? Authorizing Provider  albuterol (PROVENTIL HFA;VENTOLIN HFA) 108 (90 BASE) MCG/ACT inhaler Inhale 2 puffs into the lungs every 6 (six) hours as needed. SHORTNESS OF BREATH    Historical Provider, MD  clindamycin (CLEOCIN) 150 MG capsule Take 2 capsules (300 mg total) by mouth 3 (three) times daily. May dispense as 150mg  capsules 01/16/15   Kaitlin Pean, PA-C  ibuprofen (ADVIL,MOTRIN) 200 MG tablet Take 200 mg by mouth every 6 (six) hours as needed. Pain    Historical Provider, MD  Lactobacillus (ACIDOPHILUS PROBIOTIC) 10 MG TABS Take 10 mg by mouth 3 (three) times daily. 01/16/15   Kaitlin Pean, PA-C  TEA TREE OIL EX Apply 1 application topically daily.    Historical Provider, MD  trimethoprim-polymyxin b (POLYTRIM) ophthalmic solution Place 1 drop into the left eye every 4 (four) hours. 01/16/15   Kaitlin Pean, PA-C   Triage Vitals: BP 127/66 mmHg  Pulse 89  Temp(Src) 98.7 F (37.1 C) (Oral)  Resp 16  SpO2 100%  LMP 12/25/2014 Physical Exam  Constitutional: She appears well-developed and well-nourished. No distress.  Nontoxic appearing.  HENT:  Head: Normocephalic and atraumatic.  Eyes: EOM are normal. Pupils are equal, round, and reactive to light. Right eye exhibits no discharge. Left eye exhibits no discharge. No scleral icterus.  Left eye: Mild conjunctival injection. No  discharge noted.  Neck: Normal range of motion. Neck supple. No JVD present.  Cardiovascular: Normal rate, regular rhythm and intact distal pulses.   Pulmonary/Chest: Effort normal. No respiratory distress.  Lymphadenopathy:    She has no cervical adenopathy.  Neurological: She is alert. Coordination normal.  Skin: Skin is warm and dry. No rash noted. She is not diaphoretic. There is erythema. No pallor.  Area of induration and fluctuance to her right axilla with overlying erythema. No discharge. No surrounding erythema.    Psychiatric: She has a normal mood and affect. Her behavior is normal.  Nursing note and vitals reviewed.   ED Course  INCISION AND DRAINAGE Date/Time: 01/16/2015 4:10 PM Performed by: Kaitlin Salinas Authorized by: Kaitlin Salinas Consent: Verbal consent obtained. Risks and benefits: risks, benefits and alternatives were discussed Consent given by: patient Patient understanding: patient states understanding of the procedure being performed Patient consent: the patient's understanding of the procedure matches consent given Procedure consent: procedure consent matches procedure scheduled Relevant documents: relevant documents present and verified Site marked: the operative site was marked Imaging studies: imaging studies available Required items: required blood products, implants, devices, and special equipment available Patient identity confirmed: verbally with patient Time out: Immediately prior to procedure a "time out" was called to verify the correct patient, procedure, equipment, support staff and site/side marked as required. Type: abscess Location: right axilla.  Anesthesia: local infiltration Local anesthetic: lidocaine 1% without epinephrine Anesthetic total: 2 ml Patient sedated: no Scalpel size: 11 Incision type: single straight Complexity: simple Drainage: serosanguinous Drainage amount: moderate Wound treatment: wound left open Packing material: none Patient tolerance: Patient tolerated the procedure well with no immediate complications   (including critical care time)  COORDINATION OF CARE: 4:47 PM- Patient agrees to bedside ultrasound to assess need for I&D. Discussed treatment plan with patient at bedside and patient agreed to plan.   Labs Review Labs Reviewed - No data to display  Imaging Review No results found.   EKG Interpretation None     Filed Vitals:   01/16/15 1530 01/16/15 1734  BP: 127/66 120/71  Pulse: 89 85  Temp: 98.7 F (37.1 C)    TempSrc: Oral   Resp: 16 18  SpO2: 100% 100%    EMERGENCY DEPARTMENT US SOFT TISSUE INTERPRETATION "Study: Limited Ultrasound of the noted body part in comments below"  INDICATIONS: Pain Multiple views of the body part are obtained with a multi-frequency linear probe  PERFORMED BY:  Myself  IMAGES ARCHIVED?: Yes  SIDE:Right   BODY PART:Axilla  FINDINGS: Abcess present  LIMITATIONS:  None   INTERPRETATION:  Abcess present  COMMENT:  Abscess without cellulitis present to right axilla.    MDM   Meds given in ED:  Medications  lidocaine (PF) (XYLOCAINE) 1 % injection 5 mL (5 mLs Infiltration Given by Other 01/16/15 1735)    Discharge Medication List as of 01/16/2015  5:26 PM    START taking these medications   Details  clindamycin (CLEOCIN) 150 MG capsule Take 2 capsules (300 mg total) by mouth 3 (three) times daily. May dispense as 150mg  capsules, Starting 01/16/2015, Until Discontinued, Print    Lactobacillus (ACIDOPHILUS PROBIOTIC) 10 MG TABS Take 10 mg by mouth 3 (three) times daily., Starting 01/16/2015, Until Discontinued, Print    trimethoprim-polymyxin b (POLYTRIM) ophthalmic solution Place 1 drop into the left eye every 4 (four) hours., Starting 01/16/2015, Until Discontinued, Print        Final diagnoses:  Abscess of axilla, right  Conjunctivitis  of left eye   This is a 34 year old female with chief complaints. Her first complaint is eye redness and irritation for the past 2 days. She also reports associated eye matting yesterday.On exam the patient has left-sided conjunctival injection. She denies any double vision or blurry vision. Will prescribe antibiotic eyedrops for conjunctivitis. Patient also complaining of an abscess to her right axilla for the past 2 weeks.she reports history of previous abscesses in her left axilla which required incision and drainage. On exam patient is afebrile nontoxic appearing. She has an area of induration and fluctuance to her  right axilla without drainage. There is overlying erythema without surrounding erythema. Ultrasound used to determine if there is an abscess truly present. Soft tissue ultrasound indicated an abscess. Incision and drainage performed by me and tolerated well by the patient. There was a moderate amount of serosanguineous fluid obtained. Patient still has a large amount of induration and overlying erythema. Will discharge with clindamycin, probiotic and eye drops. Wound care instructions given. I advised the patient to follow-up with their primary care provider this week. I advised the patient to return to the emergency department with new or worsening symptoms or new concerns. The patient verbalized understanding and agreement with plan.    I personally performed the services described in this documentation, which was scribed in my presence. The recorded information has been reviewed and is accurate.       Kaitlin Pean, PA-C 01/16/15 1938  Evelina Bucy, MD 01/16/15 [phone]

## 2015-02-05 ENCOUNTER — Other Ambulatory Visit (HOSPITAL_COMMUNITY)
Admission: RE | Admit: 2015-02-05 | Discharge: 2015-02-05 | Disposition: A | Discharge status: Home / Self Care | Payer: 59 | Source: Ambulatory Visit | Attending: Internal Medicine | Admitting: Internal Medicine

## 2015-02-05 ENCOUNTER — Other Ambulatory Visit: Payer: Self-pay | Admitting: Internal Medicine

## 2015-02-05 DIAGNOSIS — Z01411 Encounter for gynecological examination (general) (routine) with abnormal findings: Secondary | ICD-10-CM | POA: Diagnosis not present

## 2015-02-05 DIAGNOSIS — Z1151 Encounter for screening for human papillomavirus (HPV): Secondary | ICD-10-CM | POA: Diagnosis present

## 2015-02-07 LAB — CYTOLOGY - PAP

## 2015-02-16 ENCOUNTER — Encounter (HOSPITAL_COMMUNITY): Payer: Self-pay | Admitting: Emergency Medicine

## 2015-02-16 ENCOUNTER — Emergency Department (HOSPITAL_COMMUNITY)
Admission: EM | Admit: 2015-02-16 | Discharge: 2015-02-17 | Disposition: A | Discharge status: Home / Self Care | Payer: 59 | Attending: Emergency Medicine | Admitting: Emergency Medicine

## 2015-02-16 DIAGNOSIS — Y9289 Other specified places as the place of occurrence of the external cause: Secondary | ICD-10-CM | POA: Diagnosis not present

## 2015-02-16 DIAGNOSIS — S46011A Strain of muscle(s) and tendon(s) of the rotator cuff of right shoulder, initial encounter: Secondary | ICD-10-CM | POA: Diagnosis not present

## 2015-02-16 DIAGNOSIS — Z792 Long term (current) use of antibiotics: Secondary | ICD-10-CM | POA: Insufficient documentation

## 2015-02-16 DIAGNOSIS — S43421A Sprain of right rotator cuff capsule, initial encounter: Secondary | ICD-10-CM | POA: Diagnosis not present

## 2015-02-16 DIAGNOSIS — Y9371 Activity, boxing: Secondary | ICD-10-CM | POA: Diagnosis not present

## 2015-02-16 DIAGNOSIS — Y998 Other external cause status: Secondary | ICD-10-CM | POA: Insufficient documentation

## 2015-02-16 DIAGNOSIS — X58XXXA Exposure to other specified factors, initial encounter: Secondary | ICD-10-CM | POA: Diagnosis not present

## 2015-02-16 DIAGNOSIS — Z880 Allergy status to penicillin: Secondary | ICD-10-CM | POA: Insufficient documentation

## 2015-02-16 DIAGNOSIS — Z79899 Other long term (current) drug therapy: Secondary | ICD-10-CM | POA: Diagnosis not present

## 2015-02-16 DIAGNOSIS — S4991XA Unspecified injury of right shoulder and upper arm, initial encounter: Secondary | ICD-10-CM | POA: Diagnosis present

## 2015-02-16 DIAGNOSIS — J45909 Unspecified asthma, uncomplicated: Secondary | ICD-10-CM | POA: Insufficient documentation

## 2015-02-16 NOTE — ED Notes (Signed)
Pt c/o right shoulder pain, able to move arm independently, no obvious deformity to same. C/o slight numbness in finger tips.

## 2015-02-16 NOTE — ED Notes (Signed)
No answer from lobby  

## 2015-02-17 ENCOUNTER — Emergency Department (HOSPITAL_COMMUNITY): Payer: 59

## 2015-02-17 MED ORDER — CYCLOBENZAPRINE HCL 5 MG PO TABS: 5.0000 mg | ORAL_TABLET | Freq: Three times a day (TID) | ORAL | Status: DC | PRN

## 2015-02-17 MED ORDER — IBUPROFEN 600 MG PO TABS
600.0000 mg | ORAL_TABLET | Freq: Four times a day (QID) | ORAL | Status: DC | PRN
Start: 2015-02-17 — End: 2015-05-04

## 2015-02-17 NOTE — Discharge Instructions (Signed)
Cryotherapy Cryotherapy is when you put ice on your injury. Ice helps lessen pain and puffiness (swelling) after an injury. Ice works the best when you start using it in the first 24 to 48 hours after an injury. HOME CARE  Put a dry or damp towel between the ice pack and your skin.  You may press gently on the ice pack.  Leave the ice on for no more than 10 to 20 minutes at a time.  Check your skin after 5 minutes to make sure your skin is okay.  Rest at least 20 minutes between ice pack uses.  Stop using ice when your skin loses feeling (numbness).  Do not use ice on someone who cannot tell you when it hurts. This includes small children and people with memory problems (dementia). GET HELP RIGHT AWAY IF:  You have white spots on your skin.  Your skin turns blue or pale.  Your skin feels waxy or hard.  Your puffiness gets worse. MAKE SURE YOU:   Understand these instructions.  Will watch your condition.  Will get help right away if you are not doing well or get worse. Document Released: 12/03/2007 Document Revised: 09/08/2011 Document Reviewed: 02/06/2011 [employer] Patient Information 2015 [employer]. This information is not intended to replace advice given to you by your health care provider. Make sure you discuss any questions you have with your health care provider.  

## 2015-02-17 NOTE — ED Provider Notes (Signed)
CSN: [id number]     Arrival date & time 02/16/15  2341 History   First MD Initiated Contact with Patient 02/17/15 [phone]     Chief Complaint  Patient presents with  . Shoulder Pain     (Consider location/radiation/quality/duration/timing/severity/associated sxs/prior Treatment) HPI Comments: Asian states that several days ago.  She was at the bar and she was playing and boxing game.  She has some discomfort in her right shoulder at that time.  Last night she was reaching for something and felt extreme sharp pain in her shoulder joint.  She is taking over-the-counter ibuprofen with no relief.  Denies any numbness or tingling  Patient is a 34 y.o. female presenting with shoulder pain. The history is provided by the patient.  Shoulder Pain Location:  Shoulder Pain details:    Quality:  Aching   Severity:  Mild   Onset quality:  Gradual   Timing:  Constant   Progression:  Worsening Chronicity:  New Handedness:  Right-handed Dislocation: no   Prior injury to area:  No Relieved by:  Nothing Associated symptoms: no fever     Past Medical History  Diagnosis Date  . Asthma    Past Surgical History  Procedure Laterality Date  . Fracture surgery     No family history on file. Social History  Substance Use Topics  . Smoking status: Never Smoker   . Smokeless tobacco: Never Used  . Alcohol Use: Yes     Comment: occ   OB History    No data available     Review of Systems  Constitutional: Negative for fever.  Musculoskeletal: Positive for arthralgias. Negative for joint swelling.  Neurological: Negative for dizziness.  All other systems reviewed and are negative.     Allergies  Amoxicillin  Home Medications   Prior to Admission medications   Medication Sig Start Date End Date Taking? Authorizing Provider  albuterol (PROVENTIL HFA;VENTOLIN HFA) 108 (90 BASE) MCG/ACT inhaler Inhale 2 puffs into the lungs every 6 (six) hours as needed. SHORTNESS OF BREATH    Historical  Provider, MD  clindamycin (CLEOCIN) 150 MG capsule Take 2 capsules (300 mg total) by mouth 3 (three) times daily. May dispense as 150mg  capsules 01/16/15   [name], PA-C  cyclobenzaprine (FLEXERIL) 5 MG tablet Take 1 tablet (5 mg total) by mouth 3 (three) times daily as needed for muscle spasms. 02/17/15   Junius Creamer, NP  ibuprofen (ADVIL,MOTRIN) 600 MG tablet Take 1 tablet (600 mg total) by mouth every 6 (six) hours as needed. 02/17/15   Junius Creamer, NP  Lactobacillus (ACIDOPHILUS PROBIOTIC) 10 MG TABS Take 10 mg by mouth 3 (three) times daily. 01/16/15   [name], PA-C  TEA TREE OIL EX Apply 1 application topically daily.    Historical Provider, MD  trimethoprim-polymyxin b (POLYTRIM) ophthalmic solution Place 1 drop into the left eye every 4 (four) hours. 01/16/15   [name], PA-C   BP 141/84 mmHg  Pulse 79  Temp(Src) 98.5 F (36.9 C) (Oral)  Resp 18  SpO2 100%  LMP 02/14/2015 Physical Exam  Constitutional: She is oriented to person, place, and time. She appears well-developed and well-nourished.  HENT:  Head: Normocephalic.  Eyes: Pupils are equal, round, and reactive to light.  Neck: Normal range of motion.  Cardiovascular: Normal rate.   Pulmonary/Chest: Effort normal.  Musculoskeletal: She exhibits tenderness.       Right shoulder: She exhibits decreased range of motion, tenderness and pain. She exhibits no bony tenderness,  no swelling, no effusion, no deformity, no laceration and no spasm.       Arms: Neurological: She is alert and oriented to person, place, and time.  Skin: Skin is warm.  Nursing note and vitals reviewed.   ED Course  Procedures (including critical care time) Labs Review Labs Reviewed - No data to display  Imaging Review Dg Shoulder Right  02/17/2015   CLINICAL DATA:  Injured RIGHT shoulder while playing around this evening at 8:30 p.m. limited range of motion.  EXAM: RIGHT SHOULDER - 2+ VIEW  COMPARISON:  None.  FINDINGS: The humeral  head is well-formed and located. The subacromial, glenohumeral and acromioclavicular joint spaces are intact. No destructive bony lesions. Soft tissue planes are non-suspicious.  IMPRESSION: Negative.   Electronically Signed   By: Elon Alas M.D.   On: 02/17/2015 00:31   I have personally reviewed and evaluated these images and lab results as part of my medical decision-making.   EKG Interpretation None     Patient examination is consistent with a rotator cuff injury.  She has pain in 3 planes of movement.  She'll be started on anti-inflammatory and relax her and given referral to orthopedic surgeon MDM   Final diagnoses:  Rotator cuff (capsule) sprain and strain, right, initial encounter         Junius Creamer, NP 02/17/15 0237  Junius Creamer, NP 02/17/15 [id number]  Veatrice Kells, MD 02/17/15 4627

## 2015-05-04 ENCOUNTER — Encounter (HOSPITAL_COMMUNITY): Payer: Self-pay | Admitting: Emergency Medicine

## 2015-05-04 ENCOUNTER — Emergency Department (HOSPITAL_COMMUNITY)
Admission: EM | Admit: 2015-05-04 | Discharge: 2015-05-04 | Disposition: A | Discharge status: Home / Self Care | Payer: 59 | Attending: Emergency Medicine | Admitting: Emergency Medicine

## 2015-05-04 DIAGNOSIS — L03111 Cellulitis of right axilla: Secondary | ICD-10-CM | POA: Insufficient documentation

## 2015-05-04 DIAGNOSIS — Z79899 Other long term (current) drug therapy: Secondary | ICD-10-CM | POA: Diagnosis not present

## 2015-05-04 DIAGNOSIS — J45909 Unspecified asthma, uncomplicated: Secondary | ICD-10-CM | POA: Insufficient documentation

## 2015-05-04 DIAGNOSIS — Z880 Allergy status to penicillin: Secondary | ICD-10-CM | POA: Diagnosis not present

## 2015-05-04 MED ORDER — IBUPROFEN 800 MG PO TABS: 800.0000 mg | ORAL_TABLET | Freq: Three times a day (TID) | ORAL | Status: DC

## 2015-05-04 MED ORDER — CLINDAMYCIN HCL 150 MG PO CAPS: 450.0000 mg | ORAL_CAPSULE | Freq: Four times a day (QID) | ORAL | Status: DC

## 2015-05-04 NOTE — Discharge Instructions (Signed)

## 2015-05-04 NOTE — ED Notes (Signed)
Per pt, states abscess under right underarm-has been there on and off for 6 months-tried to have it drained in past-states it is recurrent

## 2015-05-04 NOTE — ED Provider Notes (Signed)
CSN: [id number]     Arrival date & time 05/04/15  1256 History  By signing my name below, I, Kaitlin Salinas, attest that this documentation has been prepared under the direction and in the presence of Gloriann Loan, PA-C. Electronically Signed: Rayna Salinas, ED Scribe. 05/04/2015. 3:09 PM.   Chief Complaint  Patient presents with  . Abscess   The history is provided by the patient. No language interpreter was used.    HPI Comments: Kaitlin Salinas is a 34 y.o. female with a hx of recurrent abscesses who presents to the Emergency Department complaining of a point of intermittent swelling and tenderness to her right axilla region with onset 6 months ago and a recent worsening of her symptoms. Pt notes having been seen in the past for the same symptom to her right axilla noting that an I&D was performed and no drainage was present further noting being prescribed abx which provided no relief. She notes worsening of her swelling when applying deodorant (Degree) or sweating and a worsening of her pain with palpation. Pt also notes a similar point of swelling in her left axilla region which drained and has since alleviated. She confirms having a PCP who recommended she switch deodorants. She denies any hx of DM. She denies any known drug allergies. Pt denies any fevers.   Past Medical History  Diagnosis Date  . Asthma    Past Surgical History  Procedure Laterality Date  . Fracture surgery     No family history on file. Social History  Substance Use Topics  . Smoking status: Never Smoker   . Smokeless tobacco: Never Used  . Alcohol Use: Yes     Comment: occ   OB History    No data available     Review of Systems A complete 10 system review of systems was obtained and all systems are negative except as noted in the HPI and PMH.   Allergies  Amoxicillin  Home Medications   Prior to Admission medications   Medication Sig Start Date End Date Taking? Authorizing Provider  albuterol  (PROVENTIL HFA;VENTOLIN HFA) 108 (90 BASE) MCG/ACT inhaler Inhale 2 puffs into the lungs every 6 (six) hours as needed. SHORTNESS OF BREATH    Historical Provider, MD  clindamycin (CLEOCIN) 150 MG capsule Take 3 capsules (450 mg total) by mouth 4 (four) times daily. 05/04/15   Gloriann Loan, PA-C  cyclobenzaprine (FLEXERIL) 5 MG tablet Take 1 tablet (5 mg total) by mouth 3 (three) times daily as needed for muscle spasms. 02/17/15   Junius Creamer, NP  ibuprofen (ADVIL,MOTRIN) 800 MG tablet Take 1 tablet (800 mg total) by mouth 3 (three) times daily. 05/04/15   Gloriann Loan, PA-C  Lactobacillus (ACIDOPHILUS PROBIOTIC) 10 MG TABS Take 10 mg by mouth 3 (three) times daily. 01/16/15   [name], PA-C  TEA TREE OIL EX Apply 1 application topically daily.    Historical Provider, MD  trimethoprim-polymyxin b (POLYTRIM) ophthalmic solution Place 1 drop into the left eye every 4 (four) hours. 01/16/15   [name], PA-C   BP 124/75 mmHg  Pulse 73  Temp(Src) 98.5 F (36.9 C) (Oral)  Resp 18  SpO2 100%  LMP 04/03/2015 Physical Exam  Constitutional: She is oriented to person, place, and time. She appears well-developed and well-nourished.  HENT:  Head: Normocephalic and atraumatic.  Mouth/Throat: No oropharyngeal exudate.  Neck: Normal range of motion. Neck supple. No tracheal deviation present.  Cardiovascular: Normal rate, regular rhythm and normal heart sounds.  Pulses:      Radial pulses are 2+ on the right side, and 2+ on the left side.  Pulmonary/Chest: Effort normal and breath sounds normal. No respiratory distress.  Abdominal: Soft. There is no tenderness.  Musculoskeletal: Normal range of motion.  Neurological: She is alert and oriented to person, place, and time.  Skin: Skin is warm and dry. She is not diaphoretic.  Lesion under right arm with scars from previous incisions.  No fluctuance.  Small area of erythema on lateral aspect with mild induration.  Mildly TTP. No drainage.  Psychiatric:  She has a normal mood and affect. Her behavior is normal.  Nursing note and vitals reviewed.  ED Course  Procedures  DIAGNOSTIC STUDIES: Oxygen Saturation is 100% on RA, normal by my interpretation.    COORDINATION OF CARE: 2:42 PM Pt presents today due to swelling and tenderness to her right axilla region. Discussed treatment plan with pt at bedside including a referral to a dermatologist and rx's for keflex and motrin. Return precautions noted. Pt agreed to plan.  Labs Review Labs Reviewed - No data to display  Imaging Review No results found.   EKG Interpretation None      MDM   Final diagnoses:  Cellulitis of right axilla    Patient here with recurrent skin infections.  VSS, NAD.  Lesion has small area of erythema and induration consistent with cellulitis.  Discussed risks and benefits of I&D.  Patient declined.  Will d/c home with keflex.  PCP follow up.  Evaluation does not show pathology requring ongoing emergent intervention or admission. Pt is hemodynamically stable and mentating appropriately. Discussed findings/results and plan with patient/guardian, who agrees with plan. All questions answered. Return precautions discussed and outpatient follow up given.   I personally performed the services described in this documentation, which was scribed in my presence. The recorded information has been reviewed and is accurate.    Gloriann Loan, PA-C 05/04/15 [place] Yao, MD 05/04/15 [phone]

## 2015-12-07 ENCOUNTER — Inpatient Hospital Stay (HOSPITAL_COMMUNITY): Payer: BLUE CROSS/BLUE SHIELD

## 2015-12-07 ENCOUNTER — Encounter (HOSPITAL_COMMUNITY): Payer: Self-pay | Admitting: *Deleted

## 2015-12-07 ENCOUNTER — Inpatient Hospital Stay (HOSPITAL_COMMUNITY)
Admission: AD | Admit: 2015-12-07 | Discharge: 2015-12-07 | Disposition: A | Discharge status: Home / Self Care | Payer: BLUE CROSS/BLUE SHIELD | Source: Ambulatory Visit | Attending: Obstetrics & Gynecology | Admitting: Obstetrics & Gynecology

## 2015-12-07 DIAGNOSIS — Z3A01 Less than 8 weeks gestation of pregnancy: Secondary | ICD-10-CM | POA: Insufficient documentation

## 2015-12-07 DIAGNOSIS — O4691 Antepartum hemorrhage, unspecified, first trimester: Secondary | ICD-10-CM | POA: Diagnosis not present

## 2015-12-07 DIAGNOSIS — O209 Hemorrhage in early pregnancy, unspecified: Secondary | ICD-10-CM | POA: Diagnosis not present

## 2015-12-07 DIAGNOSIS — O43891 Other placental disorders, first trimester: Secondary | ICD-10-CM

## 2015-12-07 LAB — URINALYSIS, ROUTINE W REFLEX MICROSCOPIC
BILIRUBIN URINE: NEGATIVE
Glucose, UA: NEGATIVE mg/dL
Ketones, ur: NEGATIVE mg/dL
Leukocytes, UA: NEGATIVE
NITRITE: NEGATIVE
PROTEIN: NEGATIVE mg/dL
Specific Gravity, Urine: 1.02 (ref 1.005–1.030)
pH: 6.5 (ref 5.0–8.0)

## 2015-12-07 LAB — WET PREP, GENITAL
Clue Cells Wet Prep HPF POC: NONE SEEN
SPERM: NONE SEEN
TRICH WET PREP: NONE SEEN
WBC, Wet Prep HPF POC: NONE SEEN
Yeast Wet Prep HPF POC: NONE SEEN

## 2015-12-07 LAB — URINE MICROSCOPIC-ADD ON

## 2015-12-07 LAB — GC/CHLAMYDIA PROBE AMP (~~LOC~~) NOT AT ARMC
CHLAMYDIA, DNA PROBE: NEGATIVE
NEISSERIA GONORRHEA: NEGATIVE

## 2015-12-07 LAB — CBC
HCT: 33 % — ABNORMAL LOW (ref 36.0–46.0)
Hemoglobin: 11.1 g/dL — ABNORMAL LOW (ref 12.0–15.0)
MCH: 25.8 pg — ABNORMAL LOW (ref 26.0–34.0)
MCHC: 33.6 g/dL (ref 30.0–36.0)
MCV: 76.7 fL — ABNORMAL LOW (ref 78.0–100.0)
Platelets: 283 10*3/uL (ref 150–400)
RBC: 4.3 MIL/uL (ref 3.87–5.11)
RDW: 15.9 % — ABNORMAL HIGH (ref 11.5–15.5)
WBC: 16.1 10*3/uL — ABNORMAL HIGH (ref 4.0–10.5)

## 2015-12-07 LAB — POCT PREGNANCY, URINE: PREG TEST UR: POSITIVE — AB

## 2015-12-07 LAB — HCG, QUANTITATIVE, PREGNANCY: hCG, Beta Chain, Quant, S: 43457 m[IU]/mL — ABNORMAL HIGH (ref <5)

## 2015-12-07 LAB — HIV ANTIBODY (ROUTINE TESTING W REFLEX): HIV Screen 4th Generation wRfx: NONREACTIVE

## 2015-12-07 LAB — RPR: RPR Ser Ql: NONREACTIVE

## 2015-12-07 NOTE — Discharge Instructions (Signed)
Subchorionic Hematoma °A subchorionic hematoma is a gathering of blood between the outer wall of the placenta and the inner wall of the womb (uterus). The placenta is the organ that connects the fetus to the wall of the uterus. The placenta performs the feeding, breathing (oxygen to the fetus), and waste removal (excretory work) of the fetus.  °Subchorionic hematoma is the most common abnormality found on a result from ultrasonography done during the first trimester or early second trimester of pregnancy. If there has been little or no vaginal bleeding, early small hematomas usually shrink on their own and do not affect your baby or pregnancy. The blood is gradually absorbed over 1-2 weeks. When bleeding starts later in pregnancy or the hematoma is larger or occurs in an older pregnant woman, the outcome may not be as good. Larger hematomas may get bigger, which increases the chances for miscarriage. Subchorionic hematoma also increases the risk of premature detachment of the placenta from the uterus, preterm (premature) labor, and stillbirth. °HOME CARE INSTRUCTIONS °· Stay on bed rest if your health care provider recommends this. Although bed rest will not prevent more bleeding or prevent a miscarriage, your health care provider may recommend bed rest until you are advised otherwise. °· Avoid heavy lifting (more than 10 lb [4.5 kg]), exercise, sexual intercourse, or douching as directed by your health care provider. °· Keep track of the number of pads you use each day and how soaked (saturated) they are. Write down this information. °· Do not use tampons. °· Keep all follow-up appointments as directed by your health care provider. Your health care provider may ask you to have follow-up blood tests or ultrasound tests or both. °SEEK IMMEDIATE MEDICAL CARE IF: °· You have severe cramps in your stomach, back, abdomen, or pelvis. °· You have a fever. °· You pass large clots or tissue. Save any tissue for your health  care provider to look at. °· Your bleeding increases or you become lightheaded, feel weak, or have fainting episodes. °  °This information is not intended to replace advice given to you by your health care provider. Make sure you discuss any questions you have with your health care provider. °  °Document Released: 10/01/2006 Document Revised: 07/07/2014 Document Reviewed: 01/13/2013 °Elsevier Interactive Patient Education ©2016 [employer] ° °

## 2015-12-07 NOTE — MAU Provider Note (Signed)
History     CSN: [id number]  Arrival date and time: 12/07/15 0040   First Provider Initiated Contact with Patient 12/07/15 0155      Chief Complaint  Patient presents with  . Vaginal Bleeding   Vaginal Bleeding The patient's primary symptoms include pelvic pain and vaginal bleeding. This is a new problem. The current episode started today. The problem occurs intermittently. The problem has been unchanged. The pain is mild. The problem affects both sides. Associated symptoms include abdominal pain, nausea and vomiting. Pertinent negatives include no chills, constipation, diarrhea, dysuria, fever, frequency or urgency. The vaginal discharge was bloody. The vaginal bleeding is spotting. She has been passing clots (passed a clot about the size of an apple ). She has not been passing tissue. Nothing aggravates the symptoms. She has tried nothing for the symptoms. Her menstrual history has been regular (LMP 10/20/15 ).     Past Medical History  Diagnosis Date  . Asthma     Past Surgical History  Procedure Laterality Date  . Fracture surgery      History reviewed. No pertinent family history.  Social History  Substance Use Topics  . Smoking status: Never Smoker   . Smokeless tobacco: Never Used  . Alcohol Use: Yes     Comment: occ    Allergies:  Allergies  Allergen Reactions  . Amoxicillin Nausea And Vomiting    Prescriptions prior to admission  Medication Sig Dispense Refill Last Dose  . albuterol (PROVENTIL HFA;VENTOLIN HFA) 108 (90 BASE) MCG/ACT inhaler Inhale 2 puffs into the lungs every 6 (six) hours as needed. SHORTNESS OF BREATH   More than a month at Unknown time  . clindamycin (CLEOCIN) 150 MG capsule Take 3 capsules (450 mg total) by mouth 4 (four) times daily. (Patient not taking: Reported on 12/07/2015) 60 capsule 0   . cyclobenzaprine (FLEXERIL) 5 MG tablet Take 1 tablet (5 mg total) by mouth 3 (three) times daily as needed for muscle spasms. (Patient not taking:  Reported on 12/07/2015) 30 tablet 0   . ibuprofen (ADVIL,MOTRIN) 800 MG tablet Take 1 tablet (800 mg total) by mouth 3 (three) times daily. (Patient not taking: Reported on 12/07/2015) 21 tablet 0   . Lactobacillus (ACIDOPHILUS PROBIOTIC) 10 MG TABS Take 10 mg by mouth 3 (three) times daily. (Patient not taking: Reported on 12/07/2015) 30 tablet 0   . TEA TREE OIL EX Apply 1 application topically daily.   Past Week at Unknown  . trimethoprim-polymyxin b (POLYTRIM) ophthalmic solution Place 1 drop into the left eye every 4 (four) hours. (Patient not taking: Reported on 12/07/2015) 10 mL 0     Review of Systems  Constitutional: Negative for fever and chills.  Gastrointestinal: Positive for nausea, vomiting and abdominal pain. Negative for diarrhea and constipation.  Genitourinary: Positive for vaginal bleeding and pelvic pain. Negative for dysuria, urgency and frequency.   Physical Exam   Blood pressure 110/58, pulse 64, temperature 98.7 F (37.1 C), temperature source Oral, resp. rate 18, height 5\' 6"  (1.676 m), weight 101.606 kg (224 lb), last menstrual period 10/20/2015.  Physical Exam  Nursing note and vitals reviewed. Constitutional: She is oriented to person, place, and time. She appears well-developed and well-nourished. No distress.  HENT:  Head: Normocephalic.  Cardiovascular: Normal rate.   Respiratory: Effort normal.  GI: Soft. There is no tenderness. There is no rebound.  Genitourinary:   External: no lesion Vagina: small amount of white discharge Cervix: pink, smooth, no CMT Uterus: NSSC Adnexa:  NT   Neurological: She is alert and oriented to person, place, and time.  Skin: Skin is warm and dry.  Psychiatric: She has a normal mood and affect.   Results for orders placed or performed during the hospital encounter of 12/07/15 (from the past 24 hour(s))  Urinalysis, Routine w reflex microscopic (not at [facility])     Status: Abnormal   Collection Time: 12/07/15 12:17 AM  Result  Value Ref Range   Color, Urine YELLOW YELLOW   APPearance CLEAR CLEAR   Specific Gravity, Urine 1.020 1.005 - 1.030   pH 6.5 5.0 - 8.0   Glucose, UA NEGATIVE NEGATIVE mg/dL   Hgb urine dipstick LARGE (A) NEGATIVE   Bilirubin Urine NEGATIVE NEGATIVE   Ketones, ur NEGATIVE NEGATIVE mg/dL   Protein, ur NEGATIVE NEGATIVE mg/dL   Nitrite NEGATIVE NEGATIVE   Leukocytes, UA NEGATIVE NEGATIVE  Urine microscopic-add on     Status: Abnormal   Collection Time: 12/07/15 12:17 AM  Result Value Ref Range   Squamous Epithelial / LPF 0-5 (A) NONE SEEN   WBC, UA 0-5 0 - 5 WBC/hpf   RBC / HPF 6-30 0 - 5 RBC/hpf   Bacteria, UA FEW (A) NONE SEEN  Pregnancy, urine POC     Status: Abnormal   Collection Time: 12/07/15  1:29 AM  Result Value Ref Range   Preg Test, Ur POSITIVE (A) NEGATIVE  CBC     Status: Abnormal   Collection Time: 12/07/15  1:47 AM  Result Value Ref Range   WBC 16.1 (H) 4.0 - 10.5 K/uL   RBC 4.30 3.87 - 5.11 MIL/uL   Hemoglobin 11.1 (L) 12.0 - 15.0 g/dL   HCT 33.0 (L) 36.0 - 46.0 %   MCV 76.7 (L) 78.0 - 100.0 fL   MCH 25.8 (L) 26.0 - 34.0 pg   MCHC 33.6 30.0 - 36.0 g/dL   RDW 15.9 (H) 11.5 - 15.5 %   Platelets 283 150 - 400 K/uL  Wet prep, genital     Status: None   Collection Time: 12/07/15  2:00 AM  Result Value Ref Range   Yeast Wet Prep HPF POC NONE SEEN NONE SEEN   Trich, Wet Prep NONE SEEN NONE SEEN   Clue Cells Wet Prep HPF POC NONE SEEN NONE SEEN   WBC, Wet Prep HPF POC NONE SEEN NONE SEEN   Sperm NONE SEEN     MAU Course  Procedures  MDM   Assessment and Plan   1. Subchorionic hematoma, first trimester   2. Vaginal bleeding in pregnancy, first trimester    DC home Comfort measures reviewed  1st Trimester precautions  Bleeding precautions RX: none  Return to MAU as needed FU with OB as planned  Follow-up Information    Follow up with [facility].   Specialty:  Obstetrics and Gynecology   Why:  As scheduled   Contact information:    [place] [phone]        Mathis Bud 12/07/2015, 1:57 AM

## 2015-12-07 NOTE — MAU Note (Signed)
PT  SAYS SHE WENT   TO URGENT  CARE ON  5-30  - POSITIVE PREG.   -- NO BLEEDING  - NO PAIN.       TODAY HAD CRAMPS.     TONIGHT  AT 2330-   HAD BLOOD  IN UNDERWEAR   .  NOW  IN TRIAGE    - 1 LIGHT PINK  SPOT. ON PAD . PLAN  FOR [facility]-      DR MORRIS - APPOINTMENT ON 6-14.          LAST SEX-        3 WEEKS AGO.

## 2015-12-21 LAB — OB RESULTS CONSOLE HEPATITIS B SURFACE ANTIGEN: Hepatitis B Surface Ag: NEGATIVE

## 2015-12-21 LAB — OB RESULTS CONSOLE RUBELLA ANTIBODY, IGM: Rubella: IMMUNE

## 2016-01-10 LAB — OB RESULTS CONSOLE GC/CHLAMYDIA: Gonorrhea: NEGATIVE

## 2016-04-22 ENCOUNTER — Inpatient Hospital Stay (HOSPITAL_COMMUNITY)
Admission: AD | Admit: 2016-04-22 | Discharge: 2016-04-22 | Disposition: A | Discharge status: Home / Self Care | Payer: BLUE CROSS/BLUE SHIELD | Source: Ambulatory Visit | Attending: Obstetrics and Gynecology | Admitting: Obstetrics and Gynecology

## 2016-04-22 ENCOUNTER — Encounter (HOSPITAL_COMMUNITY): Payer: Self-pay | Admitting: *Deleted

## 2016-04-22 DIAGNOSIS — M79641 Pain in right hand: Secondary | ICD-10-CM | POA: Diagnosis not present

## 2016-04-22 DIAGNOSIS — Z880 Allergy status to penicillin: Secondary | ICD-10-CM | POA: Diagnosis not present

## 2016-04-22 DIAGNOSIS — M79642 Pain in left hand: Secondary | ICD-10-CM

## 2016-04-22 DIAGNOSIS — O9989 Other specified diseases and conditions complicating pregnancy, childbirth and the puerperium: Secondary | ICD-10-CM

## 2016-04-22 DIAGNOSIS — O26892 Other specified pregnancy related conditions, second trimester: Secondary | ICD-10-CM | POA: Diagnosis not present

## 2016-04-22 DIAGNOSIS — Z3A25 25 weeks gestation of pregnancy: Secondary | ICD-10-CM | POA: Insufficient documentation

## 2016-04-22 LAB — URINALYSIS, ROUTINE W REFLEX MICROSCOPIC
Bilirubin Urine: NEGATIVE
GLUCOSE, UA: NEGATIVE mg/dL
HGB URINE DIPSTICK: NEGATIVE
Ketones, ur: 15 mg/dL — AB
Leukocytes, UA: NEGATIVE
Nitrite: NEGATIVE
PH: 7 (ref 5.0–8.0)
Protein, ur: NEGATIVE mg/dL
SPECIFIC GRAVITY, URINE: 1.02 (ref 1.005–1.030)

## 2016-04-22 MED ORDER — ACETAMINOPHEN 325 MG PO TABS
650.0000 mg | ORAL_TABLET | Freq: Once | ORAL | Status: AC
  Administered 2016-04-22: 650 mg via ORAL
  Filled 2016-04-22: qty 2

## 2016-04-22 NOTE — Discharge Instructions (Signed)

## 2016-04-22 NOTE — MAU Provider Note (Signed)
Chief Complaint:  Hand Pain   First Provider Initiated Contact with Patient 04/22/16 2004     HPI: Kaitlin Salinas is a 35 y.o. G1P0 at 73w3dwho presents to maternity admissions reporting bilateral hand and finger pain related to probable carpal tunnel syndrome.  Has been wearing a brace on her right hand which is the worst, but states it is not helping.  Had trouble sleeping last night due to pain.  Has not taken any Tylenol.. She reports good fetal movement, denies LOF, vaginal bleeding, vaginal itching/burning, urinary symptoms, h/a, dizziness, n/v, diarrhea, constipation or fever/chills.  She denies headache, visual changes or RUQ abdominal pain.  Hand Pain   The incident occurred more than 1 week ago. The injury mechanism was repetitive motion. The pain is present in the left hand and right hand. The quality of the pain is described as aching, cramping and shooting. Radiates to: fingers. The pain is moderate. The pain has been fluctuating since the incident. Associated symptoms include numbness and tingling. Pertinent negatives include no muscle weakness. The symptoms are aggravated by movement. Treatments tried: braces. The treatment provided no relief.   RN Note: Pt is unable to sleep because of her hands - feels some nerve pain, middle finger is numb on R hand.  Has been told she may have carpel tunnel but numbness & pain is getting worse.  Denies abd pain, bleeding or LOF.    Past Medical History: Past Medical History:  Diagnosis Date  . Asthma     Past obstetric history: OB History  Gravida Para Term Preterm AB Living  1            SAB TAB Ectopic Multiple Live Births               # Outcome Date GA Lbr Len/2nd Weight Sex Delivery Anes PTL Lv  1 Current               Past Surgical History: Past Surgical History:  Procedure Laterality Date  . FRACTURE SURGERY      Family History: History reviewed. No pertinent family history.  Social History: Social History  Substance  Use Topics  . Smoking status: Never Smoker  . Smokeless tobacco: Never Used  . Alcohol use Yes     Comment: occ    Allergies:  Allergies  Allergen Reactions  . Amoxicillin Nausea And Vomiting    Has patient had a PCN reaction causing immediate rash, facial/tongue/throat swelling, SOB or lightheadedness with hypotension: No Has patient had a PCN reaction causing severe rash involving mucus membranes or skin necrosis: No Has patient had a PCN reaction that required hospitalization No Has patient had a PCN reaction occurring within the last 10 years: No If all of the above answers are "NO", then may proceed with Cephalosporin use.    Meds:  Prescriptions Prior to Admission  Medication Sig Dispense Refill Last Dose  . Prenatal Vit-Fe Fumarate-FA (PRENATAL MULTIVITAMIN) TABS tablet Take 1 tablet by mouth daily at 12 noon.     Marland Kitchen albuterol (PROVENTIL HFA;VENTOLIN HFA) 108 (90 BASE) MCG/ACT inhaler Inhale 2 puffs into the lungs every 6 (six) hours as needed. SHORTNESS OF BREATH   More than a month at Unknown time  . clindamycin (CLEOCIN) 150 MG capsule Take 3 capsules (450 mg total) by mouth 4 (four) times daily. (Patient not taking: Reported on 12/07/2015) 60 capsule 0   . cyclobenzaprine (FLEXERIL) 5 MG tablet Take 1 tablet (5 mg total) by mouth 3 (three)  times daily as needed for muscle spasms. (Patient not taking: Reported on 12/07/2015) 30 tablet 0   . ibuprofen (ADVIL,MOTRIN) 800 MG tablet Take 1 tablet (800 mg total) by mouth 3 (three) times daily. (Patient not taking: Reported on 12/07/2015) 21 tablet 0   . Lactobacillus (ACIDOPHILUS PROBIOTIC) 10 MG TABS Take 10 mg by mouth 3 (three) times daily. (Patient not taking: Reported on 12/07/2015) 30 tablet 0   . trimethoprim-polymyxin b (POLYTRIM) ophthalmic solution Place 1 drop into the left eye every 4 (four) hours. (Patient not taking: Reported on 12/07/2015) 10 mL 0     I have reviewed patient's Past Medical Hx, Surgical Hx, Family Hx, Social Hx,  medications and allergies.   ROS:  Review of Systems  Constitutional: Negative for chills and fever.  Gastrointestinal: Negative for abdominal pain.  Genitourinary: Negative for vaginal bleeding.  Musculoskeletal:       Bilateral hand and finger pain Right worse than left Some numbness Wakes her up at night   Neurological: Positive for tingling and numbness.   Other systems negative  Physical Exam  Patient Vitals for the past 24 hrs:  BP Temp Temp src Pulse Resp  04/22/16 1835 119/82 98.2 F (36.8 C) Oral 84 18   Constitutional: Well-developed, well-nourished female in no acute distress.  Cardiovascular: normal rate and rhythm Respiratory: normal effort, clear to auscultation bilaterally GI: Abd soft, non-tender, gravid appropriate for gestational age.    MS: Extremities nontender, no edema, normal ROM Neurologic: Alert and oriented x 4.  GU: Neg CVAT.  PELVIC EXAM:  Deferred  FHT:  Baseline 145 , moderate variability, small accelerations present, no decelerations Contractions: none   Labs: Results for orders placed or performed during the hospital encounter of 04/22/16 (from the past 24 hour(s))  Urinalysis, Routine w reflex microscopic (not at [facility])     Status: Abnormal   Collection Time: 04/22/16  7:35 PM  Result Value Ref Range   Color, Urine YELLOW YELLOW   APPearance CLEAR CLEAR   Specific Gravity, Urine 1.020 1.005 - 1.030   pH 7.0 5.0 - 8.0   Glucose, UA NEGATIVE NEGATIVE mg/dL   Hgb urine dipstick NEGATIVE NEGATIVE   Bilirubin Urine NEGATIVE NEGATIVE   Ketones, ur 15 (A) NEGATIVE mg/dL   Protein, ur NEGATIVE NEGATIVE mg/dL   Nitrite NEGATIVE NEGATIVE   Leukocytes, UA NEGATIVE NEGATIVE      Imaging:  No results found.  MAU Course/MDM: NST reviewed   Fetal heart reactive for gestational age Consult Dr Julien Girt with presentation, exam findings and test results.  Treatments in MAU included Tylenol.    Assessment: SIUP at [redacted]w[redacted]d Bilateral hand pain,  probable Carpal Tunnel syndrome Reassuring fetal heart rate tracing  Plan: Discharge home Continue wrist splints Call office in am to arrange an Orthopedic consult. Labor precautions and fetal kick counts Follow up in Office for prenatal visits and recheck of status  Encouraged to return here or to other Urgent Care/ED if she develops worsening of symptoms, increase in pain, fever, or other concerning symptoms.   Pt stable at time of discharge.  Hansel Feinstein CNM, MSN Certified Nurse-Midwife 04/22/2016 8:21 PM

## 2016-04-22 NOTE — MAU Note (Signed)
Pt is unable to sleep because of her hands - feels some nerve pain, middle finger is numb on R hand.  Has been told she may have carpel tunnel but numbness & pain is getting worse.  Denies abd pain, bleeding or LOF.

## 2016-05-18 ENCOUNTER — Inpatient Hospital Stay (EMERGENCY_DEPARTMENT_HOSPITAL)
Admission: AD | Admit: 2016-05-18 | Discharge: 2016-05-18 | Disposition: A | Discharge status: Home / Self Care | Payer: BLUE CROSS/BLUE SHIELD | Source: Ambulatory Visit | Attending: Obstetrics and Gynecology | Admitting: Obstetrics and Gynecology

## 2016-05-18 ENCOUNTER — Encounter (HOSPITAL_COMMUNITY): Payer: Self-pay | Admitting: Emergency Medicine

## 2016-05-18 ENCOUNTER — Emergency Department (HOSPITAL_COMMUNITY)
Admission: EM | Admit: 2016-05-18 | Discharge: 2016-05-18 | Disposition: A | Discharge status: Home / Self Care | Payer: BLUE CROSS/BLUE SHIELD | Attending: Emergency Medicine | Admitting: Emergency Medicine

## 2016-05-18 ENCOUNTER — Encounter (HOSPITAL_COMMUNITY): Payer: Self-pay | Admitting: *Deleted

## 2016-05-18 DIAGNOSIS — L02411 Cutaneous abscess of right axilla: Secondary | ICD-10-CM

## 2016-05-18 DIAGNOSIS — Z3A28 28 weeks gestation of pregnancy: Secondary | ICD-10-CM | POA: Insufficient documentation

## 2016-05-18 DIAGNOSIS — J45909 Unspecified asthma, uncomplicated: Secondary | ICD-10-CM | POA: Diagnosis not present

## 2016-05-18 DIAGNOSIS — Z79899 Other long term (current) drug therapy: Secondary | ICD-10-CM | POA: Diagnosis not present

## 2016-05-18 DIAGNOSIS — O99713 Diseases of the skin and subcutaneous tissue complicating pregnancy, third trimester: Secondary | ICD-10-CM | POA: Insufficient documentation

## 2016-05-18 DIAGNOSIS — Z3A29 29 weeks gestation of pregnancy: Secondary | ICD-10-CM | POA: Insufficient documentation

## 2016-05-18 DIAGNOSIS — Z880 Allergy status to penicillin: Secondary | ICD-10-CM | POA: Insufficient documentation

## 2016-05-18 DIAGNOSIS — L732 Hidradenitis suppurativa: Secondary | ICD-10-CM | POA: Diagnosis not present

## 2016-05-18 DIAGNOSIS — O99712 Diseases of the skin and subcutaneous tissue complicating pregnancy, second trimester: Secondary | ICD-10-CM | POA: Insufficient documentation

## 2016-05-18 MED ORDER — LIDOCAINE-EPINEPHRINE 1 %-1:100000 IJ SOLN
20.0000 mL | Freq: Once | INTRAMUSCULAR | Status: AC
  Administered 2016-05-18: 20 mL
  Filled 2016-05-18: qty 1

## 2016-05-18 MED ORDER — CEFTRIAXONE SODIUM 1 G IJ SOLR
1.0000 g | Freq: Once | INTRAMUSCULAR | Status: AC
  Administered 2016-05-18: 1 g via INTRAMUSCULAR
  Filled 2016-05-18: qty 10

## 2016-05-18 NOTE — ED Provider Notes (Addendum)
[facility] DEPT Provider Note   CSN: [id number] Arrival date & time: 05/18/16  1118  By signing my name below, I, Gwenlyn Fudge, attest that this documentation has been prepared under the direction and in the presence of [employer], [employer]. Electronically Signed: Gwenlyn Fudge, ED Scribe. 05/18/16. 12:19 PM.  History   Chief Complaint Chief Complaint  Patient presents with  . Abscess   The history is provided by the patient. No language interpreter was used.   HPI Comments: Kaitlin Salinas is a 35 y.o. female who presents to the Emergency Department complaining of gradual onset, recurring abscess to the right axilla onset 1 week. Pt has experienced recurring abscesses in this same area for years. She was seen at [facility] earlier today and received an injection of antibiotics before coming to [facility] ED. Over the past few weeks, she has experienced a few smaller abscesses to the same area that self resolved. Pt is is currently taking a course of Keflex. She reports associated chills. She denies fever, nausea, vomiting. Pt is currently 7 months pregnant.   Past Medical History:  Diagnosis Date  . Asthma     There are no active problems to display for this patient.   Past Surgical History:  Procedure Laterality Date  . FRACTURE SURGERY      OB History    Gravida Para Term Preterm AB Living   1             SAB TAB Ectopic Multiple Live Births                   Home Medications    Prior to Admission medications   Medication Sig Start Date End Date Taking? Authorizing Provider  albuterol (PROVENTIL HFA;VENTOLIN HFA) 108 (90 BASE) MCG/ACT inhaler Inhale 2 puffs into the lungs every 6 (six) hours as needed for wheezing or shortness of breath.     Historical Provider, MD  cephALEXin (KEFLEX) 500 MG capsule Take 500 mg by mouth 2 (two) times daily. 05/12/16 05/19/16  Historical Provider, MD  Prenatal Vit-Fe Fumarate-FA (PRENATAL MULTIVITAMIN) TABS tablet Take 1 tablet by  mouth daily.     Historical Provider, MD    Family History No family history on file.  Social History Social History  Substance Use Topics  . Smoking status: Never Smoker  . Smokeless tobacco: Never Used  . Alcohol use Yes     Comment: occ     Allergies   Amoxicillin   Review of Systems Review of Systems   10 Systems reviewed and are negative for acute change except as noted in the HPI.   Physical Exam Updated Vital Signs BP 114/68 (BP Location: Right Arm)   Pulse 82   Temp 97.6 F (36.4 C) (Oral)   Resp 18   Ht 5\' 8"  (1.727 m)   Wt 100.2 kg   LMP 10/20/2015   SpO2 100%   BMI 33.60 kg/m   Physical Exam  Constitutional: She is oriented to person, place, and time. She appears well-developed and well-nourished. No distress.  HENT:  Head: Normocephalic and atraumatic.  Mouth/Throat: Oropharynx is clear and moist.  Eyes: Conjunctivae and EOM are normal. Pupils are equal, round, and reactive to light.  Neck: Normal range of motion.  Cardiovascular: Normal rate, regular rhythm and intact distal pulses.   Pulmonary/Chest: Effort normal and breath sounds normal.  Abdominal: Soft. There is no tenderness.  Musculoskeletal: Normal range of motion.  Neurological: She is alert and oriented to person,  place, and time.  Skin: She is not diaphoretic.  Right axilla with multiple fluctuant abscesses with minimal surrounding cellulitis and no active drainage.  Psychiatric: She has a normal mood and affect.  Nursing note and vitals reviewed.    ED Treatments / Results  DIAGNOSTIC STUDIES: Oxygen Saturation is 100% on RA, normal by my interpretation.    COORDINATION OF CARE: 12:15 PM Discussed treatment plan with pt at bedside which includes Incision and drainage and pt agreed to plan.  Labs (all labs ordered are listed, but only abnormal results are displayed) Labs Reviewed - No data to display  EKG  EKG Interpretation None       Radiology No results found.    EMERGENCY DEPARTMENT US SOFT TISSUE INTERPRETATION "Study: Limited Ultrasound of the noted body part in comments below"  INDICATIONS: Soft tissue infection Multiple views of the body part are obtained with a multi-frequency linear probe  PERFORMED BY:  Myself  IMAGES ARCHIVED?: No  SIDE:Right   BODY PART:Axilla  FINDINGS: Abcess present  INTERPRETATION:  No abcess noted    Procedures .Marland KitchenIncision and Drainage Date/Time: 05/18/2016 1:59 PM Performed by: Monico Blitz Authorized by: Monico Blitz   Consent:    Consent obtained:  Verbal   Consent given by:  Patient Location:    Type:  Abscess   Size:  5   Location: right axilla. Pre-procedure details:    Skin preparation:  Chloraprep Anesthesia (see MAR for exact dosages):    Anesthesia method:  Local infiltration   Local anesthetic:  Lidocaine 2% WITH epi Procedure type:    Complexity:  Complex Procedure details:    Needle aspiration: no     Incision types:  Single straight   Scalpel blade:  11   Wound management:  Probed and deloculated and irrigated with saline   Drainage:  Purulent   Drainage amount:  Copious   Wound treatment:  Wound left open   Packing materials:  None Post-procedure details:    Patient tolerance of procedure:  Tolerated well, no immediate complications   (including critical care time)  Medications Ordered in ED Medications  lidocaine-EPINEPHrine (XYLOCAINE W/EPI) 1 %-1:100000 (with pres) injection 20 mL (20 mLs Other Given 05/18/16 1317)     Initial Impression / Assessment and Plan / ED Course  I have reviewed the triage vital signs and the nursing notes.  Pertinent labs & imaging results that were available during my care of the patient were reviewed by me and considered in my medical decision making (see chart for details).  Clinical Course    Vitals:   05/18/16 1128 05/18/16 1130 05/18/16 1352  BP: 114/68    Pulse: 81  82  Resp: 18    Temp: 97.6 F (36.4 C)     TempSrc: Oral    SpO2: 100%  100%  Weight:  100.2 kg   Height:  5\' 8"  (1.727 m)     Medications  lidocaine-EPINEPHrine (XYLOCAINE W/EPI) 1 %-1:100000 (with pres) injection 20 mL (20 mLs Other Given 05/18/16 1317)    Kaitlin Salinas is 35 y.o. female presenting with abscess to right axilla, consistent with hidradenitis, multiple prior episodes. No signs of systemic infection. She's been given Keflex by OB/GYN, she 7 months pregnant, no complaints with respect to pregnancy. I and D performed after ultrasound with expression of a large amount of purulent fluid, counseled patient on wound care and return precautions, patient given surgical referral.  Evaluation does not show pathology that would require ongoing emergent intervention  or inpatient treatment. Pt is hemodynamically stable and mentating appropriately. Discussed findings and plan with patient/guardian, who agrees with care plan. All questions answered. Return precautions discussed and outpatient follow up given.      Final Clinical Impressions(s) / ED Diagnoses   Final diagnoses:  Hydradenitis    New Prescriptions Discharge Medication List as of 05/18/2016  1:32 PM      I personally performed the services described in this documentation, which was scribed in my presence. The recorded information has been reviewed and is accurate.    Monico Blitz, PA-C 05/18/16 1403    Virgel Manifold, MD 05/26/16 [address], PA-C 06/06/16 [id number]    Virgel Manifold, MD 06/06/16 1600

## 2016-05-18 NOTE — Discharge Instructions (Signed)

## 2016-05-18 NOTE — MAU Provider Note (Signed)
History     CSN: [id number]  Arrival date and time: 05/18/16 0944   First Provider Initiated Contact with Patient 05/18/16 1009      Chief Complaint  Patient presents with  . Abscess   HPI Ms. Kaitlin Salinas is a 35 y.o. G1P0 at [redacted]w[redacted]d who presents to MAU today with complaint of right axillary abscess. The patient states that it has been present x 1 month. It will sometimes improve and then worsen. It drained a small amount earlier this week, but is not actively draining. She denies fever. She reports increased pain x 2 days. She is currently on Keflex. She has a history of hydradenitis. She denies abdominal pain, vaginal bleeding, contractions, LOF or complications with the pregnancy. She reports good fetal movement.   OB History    Gravida Para Term Preterm AB Living   1             SAB TAB Ectopic Multiple Live Births                  Past Medical History:  Diagnosis Date  . Asthma     Past Surgical History:  Procedure Laterality Date  . FRACTURE SURGERY      History reviewed. No pertinent family history.  Social History  Substance Use Topics  . Smoking status: Never Smoker  . Smokeless tobacco: Never Used  . Alcohol use Yes     Comment: occ    Allergies:  Allergies  Allergen Reactions  . Amoxicillin Nausea And Vomiting and Other (See Comments)    Has patient had a PCN reaction causing immediate rash, facial/tongue/throat swelling, SOB or lightheadedness with hypotension: No Has patient had a PCN reaction causing severe rash involving mucus membranes or skin necrosis: No Has patient had a PCN reaction that required hospitalization No Has patient had a PCN reaction occurring within the last 10 years: No If all of the above answers are "NO", then may proceed with Cephalosporin use.    Prescriptions Prior to Admission  Medication Sig Dispense Refill Last Dose  . albuterol (PROVENTIL HFA;VENTOLIN HFA) 108 (90 BASE) MCG/ACT inhaler Inhale 2 puffs into the lungs  every 6 (six) hours as needed for wheezing or shortness of breath.    Past Month at Unknown time  . cephALEXin (KEFLEX) 500 MG capsule Take 500 mg by mouth 2 (two) times daily.   05/18/2016 at Unknown time  . Prenatal Vit-Fe Fumarate-FA (PRENATAL MULTIVITAMIN) TABS tablet Take 1 tablet by mouth daily.    05/18/2016 at Unknown time    Review of Systems  Constitutional: Negative for fever and malaise/fatigue.  Gastrointestinal: Negative for abdominal pain.  Genitourinary:       Neg - vaginal bleeding, discharge, LOF  Skin:       + abscess   Physical Exam   Blood pressure 113/76, pulse 89, temperature 97.8 F (36.6 C), temperature source Oral, resp. rate 18, last menstrual period 10/20/2015, SpO2 100 %.  Physical Exam  Nursing note and vitals reviewed. Constitutional: She is oriented to person, place, and time. She appears well-developed and well-nourished. No distress.  HENT:  Head: Normocephalic and atraumatic.  Cardiovascular: Normal rate.   Respiratory: Effort normal.  GI: Soft. She exhibits no distension.  Neurological: She is alert and oriented to person, place, and time.  Skin: Skin is warm and dry. No erythema.     Psychiatric: She has a normal mood and affect.    Fetal Monitoring: Baseline: 130 bpm Variability: moderate Accelerations:  15 x 15 Decelerations: None Contractions: None  MAU Course  Procedures None  MDM Discussed patient with Dr. Helane Rima. Recommends IM dose of Rocephin in MAU and then discharge to [facility] to [facility] for further evaluation today.   Assessment and Plan  A: SIUP at [redacted]w[redacted]d Abscess, right axilla  P: Discharge home Patient advised to follow-up with [facility] for further evaluation and management Patient advised to follow-up with [facility] as scheduled for routine prenatal care or sooner PRN Patient may return to MAU as needed or if her condition were to change or worsen   Luvenia Redden, PA-C  05/18/2016, 10:39 AM

## 2016-05-18 NOTE — MAU Note (Addendum)
Pt states the boil has been reoccurring multiple times.  Pt states she is currently on antibiotics for the boil but it has gotten worse.  Pt states the boil is under the right arm.

## 2016-05-18 NOTE — ED Triage Notes (Signed)
Patient reports recurring abscess under right arm. Patient states she is currently on antibiotics for the abscess, however, the abscess appears to be getting worse.

## 2016-05-26 ENCOUNTER — Encounter (HOSPITAL_COMMUNITY): Payer: Self-pay

## 2016-05-26 ENCOUNTER — Inpatient Hospital Stay (HOSPITAL_COMMUNITY)
Admission: AD | Admit: 2016-05-26 | Discharge: 2016-05-26 | Disposition: A | Discharge status: Home / Self Care | Payer: BLUE CROSS/BLUE SHIELD | Source: Ambulatory Visit | Attending: Obstetrics and Gynecology | Admitting: Obstetrics and Gynecology

## 2016-05-26 DIAGNOSIS — R42 Dizziness and giddiness: Secondary | ICD-10-CM | POA: Diagnosis not present

## 2016-05-26 DIAGNOSIS — J45909 Unspecified asthma, uncomplicated: Secondary | ICD-10-CM | POA: Insufficient documentation

## 2016-05-26 DIAGNOSIS — R55 Syncope and collapse: Secondary | ICD-10-CM | POA: Diagnosis not present

## 2016-05-26 DIAGNOSIS — O2653 Maternal hypotension syndrome, third trimester: Secondary | ICD-10-CM | POA: Diagnosis not present

## 2016-05-26 DIAGNOSIS — O26893 Other specified pregnancy related conditions, third trimester: Secondary | ICD-10-CM | POA: Diagnosis present

## 2016-05-26 DIAGNOSIS — Z3A30 30 weeks gestation of pregnancy: Secondary | ICD-10-CM | POA: Insufficient documentation

## 2016-05-26 DIAGNOSIS — Z880 Allergy status to penicillin: Secondary | ICD-10-CM | POA: Diagnosis not present

## 2016-05-26 DIAGNOSIS — E860 Dehydration: Secondary | ICD-10-CM

## 2016-05-26 LAB — CBC
HEMATOCRIT: 31.3 % — AB (ref 36.0–46.0)
HEMOGLOBIN: 10.4 g/dL — AB (ref 12.0–15.0)
MCH: 26.6 pg (ref 26.0–34.0)
MCHC: 33.2 g/dL (ref 30.0–36.0)
MCV: 80.1 fL (ref 78.0–100.0)
Platelets: 220 10*3/uL (ref 150–400)
RBC: 3.91 MIL/uL (ref 3.87–5.11)
RDW: 15.6 % — AB (ref 11.5–15.5)
WBC: 13.6 10*3/uL — ABNORMAL HIGH (ref 4.0–10.5)

## 2016-05-26 LAB — GLUCOSE, CAPILLARY: Glucose-Capillary: 98 mg/dL (ref 65–99)

## 2016-05-26 NOTE — MAU Provider Note (Signed)
History     CSN: [id number]  Arrival date and time: 05/26/16 2107   First Provider Initiated Contact with Patient 05/26/16 2131      Chief Complaint  Patient presents with  . Dizziness   HPI Ms. Kaitlin Salinas is a 35 y.o. G1P0 at [redacted]w[redacted]d who presents to MAU today with complaint of syncope and dizziness. The patient states that she felt dizzy this evening. She had not eaten much today and had not drank much water today. She denies fall or injury. She denies abdominal pain, contractions, vaginal bleeding, discharge or LOF. She reports good fetal movement.    OB History    Gravida Para Term Preterm AB Living   1             SAB TAB Ectopic Multiple Live Births                  Past Medical History:  Diagnosis Date  . Asthma     Past Surgical History:  Procedure Laterality Date  . FRACTURE SURGERY      History reviewed. No pertinent family history.  Social History  Substance Use Topics  . Smoking status: Never Smoker  . Smokeless tobacco: Never Used  . Alcohol use Yes     Comment: occ    Allergies:  Allergies  Allergen Reactions  . Amoxicillin Nausea And Vomiting and Other (See Comments)    Has patient had a PCN reaction causing immediate rash, facial/tongue/throat swelling, SOB or lightheadedness with hypotension: No Has patient had a PCN reaction causing severe rash involving mucus membranes or skin necrosis: No Has patient had a PCN reaction that required hospitalization No Has patient had a PCN reaction occurring within the last 10 years: No If all of the above answers are "NO", then may proceed with Cephalosporin use.    Prescriptions Prior to Admission  Medication Sig Dispense Refill Last Dose  . albuterol (PROVENTIL HFA;VENTOLIN HFA) 108 (90 BASE) MCG/ACT inhaler Inhale 2 puffs into the lungs every 6 (six) hours as needed for wheezing or shortness of breath.    years at Unknown time  . IRON PO Take 1 tablet by mouth daily.   05/26/2016 at Unknown time  .  Prenatal Vit-Fe Fumarate-FA (PRENATAL MULTIVITAMIN) TABS tablet Take 1 tablet by mouth daily.    05/26/2016 at Unknown time    Review of Systems  Constitutional: Negative for fever and malaise/fatigue.  Gastrointestinal: Negative for abdominal pain, constipation, diarrhea, nausea and vomiting.  Genitourinary: Negative for dysuria, frequency and urgency.       Neg - vaginal bleeding, discharge, LOF  Neurological: Positive for dizziness. Negative for loss of consciousness.   Physical Exam   Blood pressure 118/73, pulse 109, temperature 98.6 F (37 C), temperature source Oral, height 5\' 8"  (1.727 m), weight 198 lb 4 oz (89.9 kg), last menstrual period 10/20/2015.  Physical Exam  Nursing note and vitals reviewed. Constitutional: She is oriented to person, place, and time. She appears well-developed and well-nourished. No distress.  HENT:  Head: Normocephalic and atraumatic.  Cardiovascular: Normal rate.   Respiratory: Effort normal.  GI: Soft. She exhibits no distension and no mass. There is no tenderness. There is no rebound and no guarding.  Neurological: She is alert and oriented to person, place, and time.  Skin: Skin is warm and dry. No erythema.  Psychiatric: She has a normal mood and affect.    Results for orders placed or performed during the hospital encounter of 05/26/16 (from the  past 24 hour(s))  CBC     Status: Abnormal   Collection Time: 05/26/16  9:36 PM  Result Value Ref Range   WBC 13.6 (H) 4.0 - 10.5 K/uL   RBC 3.91 3.87 - 5.11 MIL/uL   Hemoglobin 10.4 (L) 12.0 - 15.0 g/dL   HCT 31.3 (L) 36.0 - 46.0 %   MCV 80.1 78.0 - 100.0 fL   MCH 26.6 26.0 - 34.0 pg   MCHC 33.2 30.0 - 36.0 g/dL   RDW 15.6 (H) 11.5 - 15.5 %   Platelets 220 150 - 400 K/uL  Glucose, capillary     Status: None   Collection Time: 05/26/16  9:39 PM  Result Value Ref Range   Glucose-Capillary 98 65 - 99 mg/dL    Orthostatic VS for the past 24 hrs:  BP- Lying Pulse- Lying BP- Sitting Pulse-  Sitting BP- Standing at 0 minutes Pulse- Standing at 0 minutes  05/26/16 2149 - - - - 118/73 109  05/26/16 2148 - - 119/62 86 - -  05/26/16 2146 117/67 87 - - - -    Fetal Monitoring: Baseline: 130 bpm Variability: moderate Accelerations: 15 x 15 Decelerations: none Contractions: none   MAU Course  Procedures None  MDM CBC, UA, CBG today Orthostatic VS Discussed patient with Dr. Julien Girt. Agrees with plan for discharge at this time.  Assessment and Plan  A: SIUP at [redacted]w[redacted]d Dizziness Maternal hypotension  P: Discharge home Preterm labor precautions discussed Increase PO hydration and increase frequent meals throughout the day Patient advised to follow-up with [facility] as scheduled for routine prenatal care Patient may return to MAU as needed or if her condition were to change or worsen   Luvenia Redden, PA-C  05/26/2016, 10:10 PM

## 2016-05-26 NOTE — MAU Note (Signed)
PT SAYS  SHE WAS WATCHING  TV  AT 845PM-   AFTER   SHE  ATE-   THEN  SHE FELT  DIZZY .   SHE WALKED  TOWARD   FRONT   DOOR    -    BOYFRIEND   HELD  HER - AND  SAYS  SHE  PASSED  OUT-  DID NOT  HIT  HER  HEAD.     SHE  FEELS    DIZZY  NOW.     NO BLEEDING.     NO UC.    [place]- DR    MORRIS.     ALL OK  WITH  [facility] .       LAST   SEX-      2 WEEKS  AGO.

## 2016-05-26 NOTE — MAU Note (Signed)
Pt states that earlier tonight she felt dizzy and lightheaded, went outside to get some fresh air. Pt significant other was with her when she began to feel weak and he stood behind her and lowered patient to floor. Pt did not fall, hit head or abdomen. States she has a history of vertigo. Has some nausea, denies vomiting. Denies LOF, contractions or vag bleeding. +FM

## 2016-05-26 NOTE — Discharge Instructions (Signed)
Dehydration, Adult Dehydration is when there is not enough fluid or water in your body. This happens when you lose more fluids than you take in. Dehydration can range from mild to very bad. It should be treated right away to keep it from getting very bad. Symptoms of mild dehydration may include:  Thirst.  Dry lips.  Slightly dry mouth.  Dry, warm skin.  Dizziness. Symptoms of moderate dehydration may include:  Very dry mouth.  Muscle cramps.  Dark pee (urine). Pee may be the color of tea.  Your body making less pee.  Your eyes making fewer tears.  Heartbeat that is uneven or faster than normal (palpitations).  Headache.  Light-headedness, especially when you stand up from sitting.  Fainting (syncope). Symptoms of very bad dehydration may include:  Changes in skin, such as:  Cold and clammy skin.  Blotchy (mottled) or pale skin.  Skin that does not quickly return to normal after being lightly pinched and let go (poor skin turgor).  Changes in body fluids, such as:  Feeling very thirsty.  Your eyes making fewer tears.  Not sweating when body temperature is high, such as in hot weather.  Your body making very little pee.  Changes in vital signs, such as:  Weak pulse.  Pulse that is more than 100 beats a minute when you are sitting still.  Fast breathing.  Low blood pressure.  Other changes, such as:  Sunken eyes.  Cold hands and feet.  Confusion.  Lack of energy (lethargy).  Trouble waking up from sleep.  Short-term weight loss.  Unconsciousness. Follow these instructions at home:  If told by your doctor, drink an ORS:  Make an ORS by using instructions on the package.  Start by drinking small amounts, about  cup (120 mL) every 5-10 minutes.  Slowly drink more until you have had the amount that your doctor said to have.  Drink enough clear fluid to keep your pee clear or pale yellow. If you were told to drink an ORS, finish the ORS  first, then start slowly drinking clear fluids. Drink fluids such as:  Water. Do not drink only water by itself. Doing that can make the salt (sodium) level in your body get too low (hyponatremia).  Ice chips.  Fruit juice that you have added water to (diluted).  Low-calorie sports drinks.  Avoid:  Alcohol.  Drinks that have a lot of sugar. These include high-calorie sports drinks, fruit juice that does not have water added, and soda.  Caffeine.  Foods that are greasy or have a lot of fat or sugar.  Take over-the-counter and prescription medicines only as told by your doctor.  Do not take salt tablets. Doing that can make the salt level in your body get too high (hypernatremia).  Eat foods that have minerals (electrolytes). Examples include bananas, oranges, potatoes, tomatoes, and spinach.  Keep all follow-up visits as told by your doctor. This is important. Contact a doctor if:  You have belly (abdominal) pain that:  Gets worse.  Stays in one area (localizes).  You have a rash.  You have a stiff neck.  You get angry or annoyed more easily than normal (irritability).  You are more sleepy than normal.  You have a harder time waking up than normal.  You feel:  Weak.  Dizzy.  Very thirsty.  You have peed (urinated) only a small amount of very dark pee during 6-8 hours. Get help right away if:  You have symptoms of  very bad dehydration.  You cannot drink fluids without throwing up (vomiting).  Your symptoms get worse with treatment.  You have a fever.  You have a very bad headache.  You are throwing up or having watery poop (diarrhea) and it:  Gets worse.  Does not go away.  You have blood or something green (bile) in your throw-up.  You have blood in your poop (stool). This may cause poop to look black and tarry.  You have not peed in 6-8 hours.  You pass out (faint).  Your heart rate when you are sitting still is more than 100 beats a  minute.  You have trouble breathing. This information is not intended to replace advice given to you by your health care provider. Make sure you discuss any questions you have with your health care provider. Document Released: 04/12/2009 Document Revised: 01/04/2016 Document Reviewed: 08/10/2015 Elsevier Interactive Patient Education  2017 [employer].   Hypotension As your heart beats, it forces blood through your body. This force is called blood pressure. If you have hypotension, you have low blood pressure. When your blood pressure is too low, you may not get enough blood to your brain. You may feel weak, feel light-headed, have a fast heartbeat, or even pass out (faint). Follow these instructions at home: Eating and drinking  Drink enough fluids to keep your pee (urine) clear or pale yellow.  Eat a healthy diet, and follow instructions from your doctor about eating or drinking restrictions. A healthy diet includes:  Fresh fruits and vegetables.  Whole grains.  Low-fat (lean) meats.  Low-fat dairy products.  Eat extra salt only as told. Do not add extra salt to your diet unless your doctor tells you to.  Eat small meals often.  Avoid standing up quickly after you eat. Medicines  Take over-the-counter and prescription medicines only as told by your doctor.  Follow instructions from your doctor about changing how much you take (the dosage) of your medicines, if this applies.  Do not stop or change your medicine on your own. General instructions  Wear compression stockings as told by your doctor.  Get up slowly from lying down or sitting.  Avoid hot showers and a lot of heat as told by your doctor.  Return to your normal activities as told by your doctor. Ask what activities are safe for you.  Do not use any products that contain nicotine or tobacco, such as cigarettes and e-cigarettes. If you need help quitting, ask your doctor.  Keep all follow-up visits as told  by your doctor. This is important. Contact a doctor if:  You throw up (vomit).  You have watery poop (diarrhea).  You have a fever for more than 2-3 days.  You feel more thirsty than normal.  You feel weak and tired. Get help right away if:  You have chest pain.  You have a fast or irregular heartbeat.  You lose feeling (get numbness) in any part of your body.  You cannot move your arms or your legs.  You have trouble talking.  You get sweaty or feel light-headed.  You faint.  You have trouble breathing.  You have trouble staying awake.  You feel confused. This information is not intended to replace advice given to you by your health care provider. Make sure you discuss any questions you have with your health care provider. Document Released: 09/10/2009 Document Revised: 03/04/2016 Document Reviewed: 03/04/2016 Elsevier Interactive Patient Education  2017 [employer].

## 2016-06-21 ENCOUNTER — Encounter (HOSPITAL_COMMUNITY): Payer: Self-pay | Admitting: *Deleted

## 2016-06-21 ENCOUNTER — Emergency Department (HOSPITAL_COMMUNITY)
Admission: EM | Admit: 2016-06-21 | Discharge: 2016-06-21 | Disposition: A | Discharge status: Home / Self Care | Payer: BLUE CROSS/BLUE SHIELD | Attending: Dermatology | Admitting: Dermatology

## 2016-06-21 DIAGNOSIS — K0889 Other specified disorders of teeth and supporting structures: Secondary | ICD-10-CM | POA: Diagnosis present

## 2016-06-21 DIAGNOSIS — J45909 Unspecified asthma, uncomplicated: Secondary | ICD-10-CM | POA: Diagnosis not present

## 2016-06-21 DIAGNOSIS — Z5321 Procedure and treatment not carried out due to patient leaving prior to being seen by health care provider: Secondary | ICD-10-CM | POA: Insufficient documentation

## 2016-06-21 NOTE — ED Notes (Signed)
Pt sts she dosent want to wait anymore. Sts she is going home.

## 2016-06-21 NOTE — ED Triage Notes (Signed)
Pt presents with cavity to right upper molar.  Pt stated "the pain started about 2 days ago but the pain tonight got worse."

## 2016-06-30 NOTE — L&D Delivery Note (Signed)
Delivery Note At 5:21 PM a viable female was delivered via Vaginal, Spontaneous Delivery (Presentation: ROA).  APGAR: 7, 9; weight pending Placenta status: L&D.  Nuchal cord x 1 - delivered through:  with the following complications: none.  Cord pH: sent  Anesthesia:   Episiotomy: None Lacerations: 2nd degree Suture Repair: 2.0 3.0 Est. Blood Loss (mL): 350  Mom to postpartum.  Baby to Couplet care / Skin to Skin.  Kaitlin Salinas 07/26/2016, 6:10 PM

## 2016-07-05 LAB — OB RESULTS CONSOLE GBS: STREP GROUP B AG: POSITIVE

## 2016-07-16 ENCOUNTER — Encounter (HOSPITAL_COMMUNITY): Payer: Self-pay | Admitting: *Deleted

## 2016-07-16 ENCOUNTER — Inpatient Hospital Stay (HOSPITAL_COMMUNITY)
Admission: AD | Admit: 2016-07-16 | Discharge: 2016-07-16 | Disposition: A | Discharge status: Home / Self Care | Payer: BLUE CROSS/BLUE SHIELD | Source: Ambulatory Visit | Attending: Obstetrics and Gynecology | Admitting: Obstetrics and Gynecology

## 2016-07-16 DIAGNOSIS — Z3A35 35 weeks gestation of pregnancy: Secondary | ICD-10-CM | POA: Insufficient documentation

## 2016-07-16 LAB — POCT FERN TEST: POCT Fern Test: NEGATIVE

## 2016-07-16 NOTE — MAU Note (Signed)
Pt reports she says she has been leaking some fluid since day before yesterday. Having some contractions. Good fetal movement reported.

## 2016-07-20 IMAGING — US US OB TRANSVAGINAL
1 series · 15 of 28 positions shown · non-contrast
Comparison: 12/14/2010

CLINICAL DATA: Bleeding and first-trimester pregnancy

EXAM:
OBSTETRIC <14 WK US AND TRANSVAGINAL OB US
TECHNIQUE: Both transabdominal and transvaginal ultrasound examinations were
performed for complete evaluation of the gestation as well as the
maternal uterus, adnexal regions, and pelvic cul-de-sac.
Transvaginal technique was performed to assess early pregnancy.

[Series 1: us ob transvaginal · 15 of 62 slices shown]
[im 1/62]
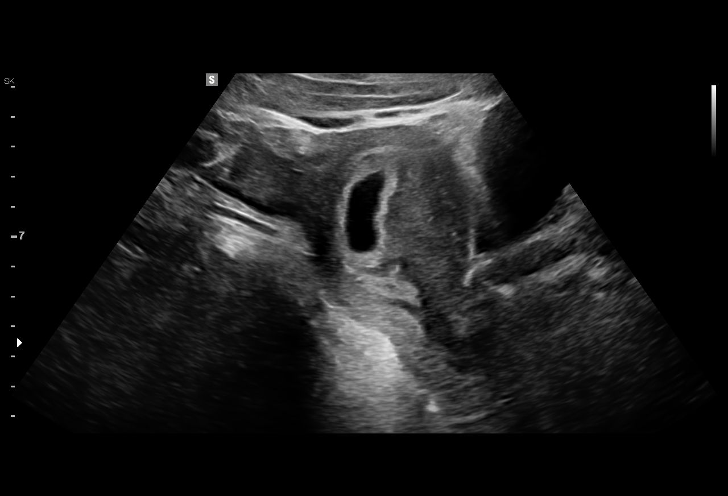
[im 5/62]
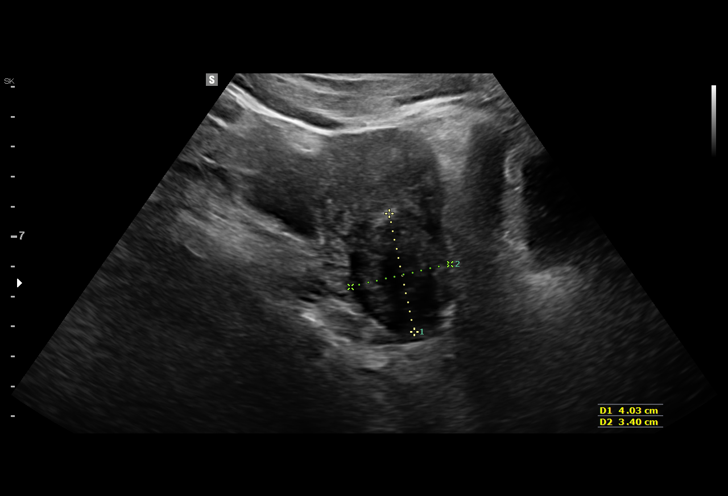
[im 10/62]
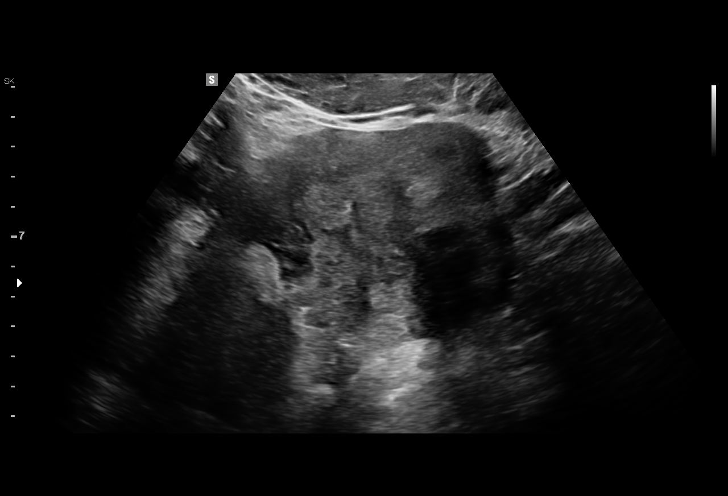
[im 14/62]
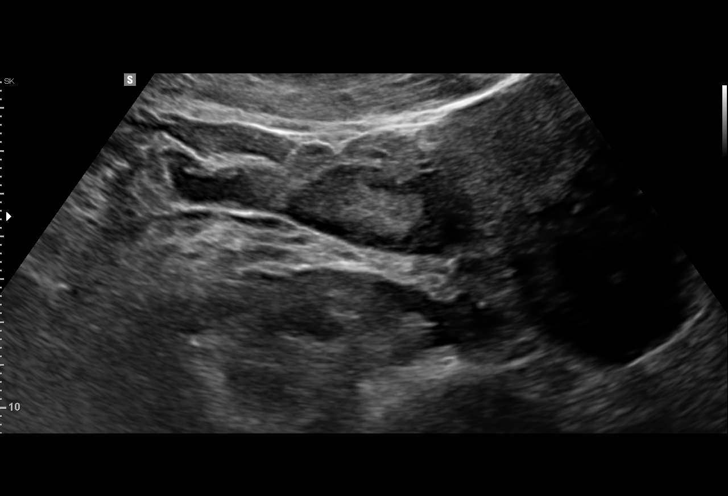
[im 19/62]
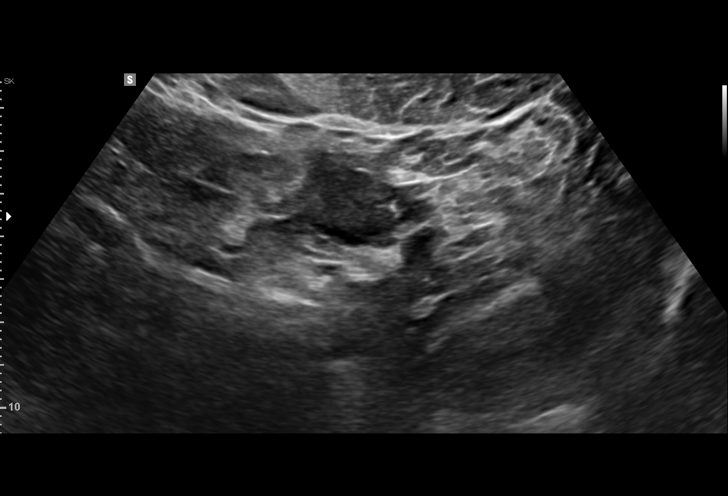
[im 23/62]
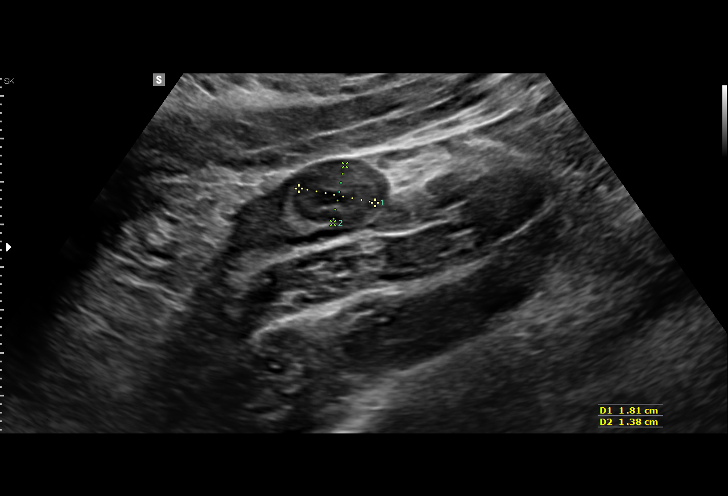
[im 28/62]
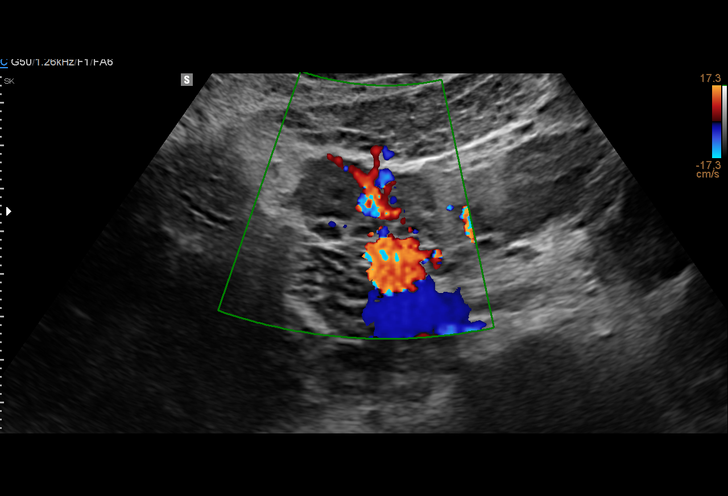
[im 32/62]
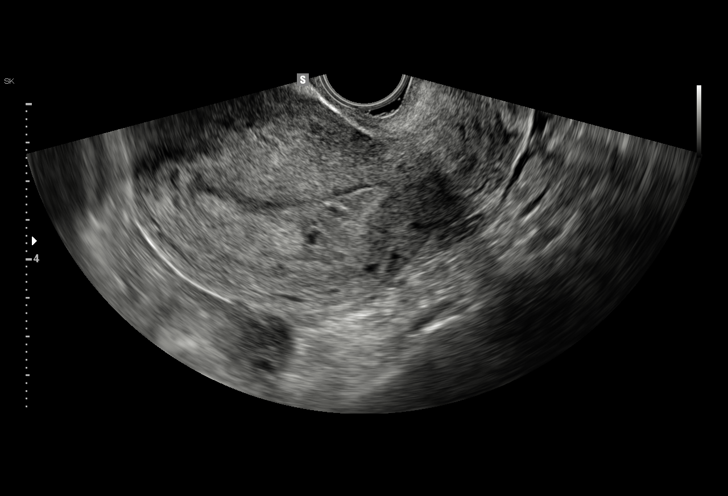
[im 34/62]
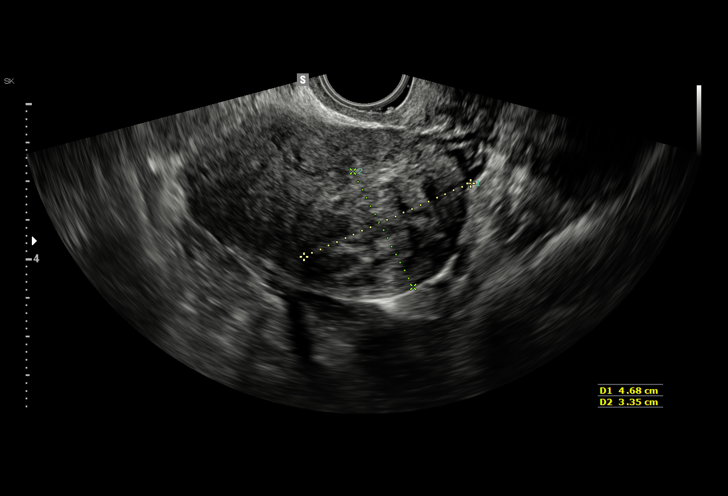
[im 39/62]
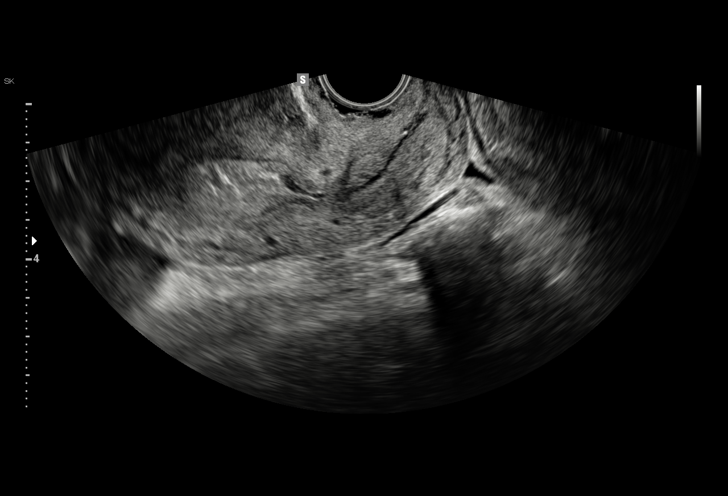
[im 43/62]
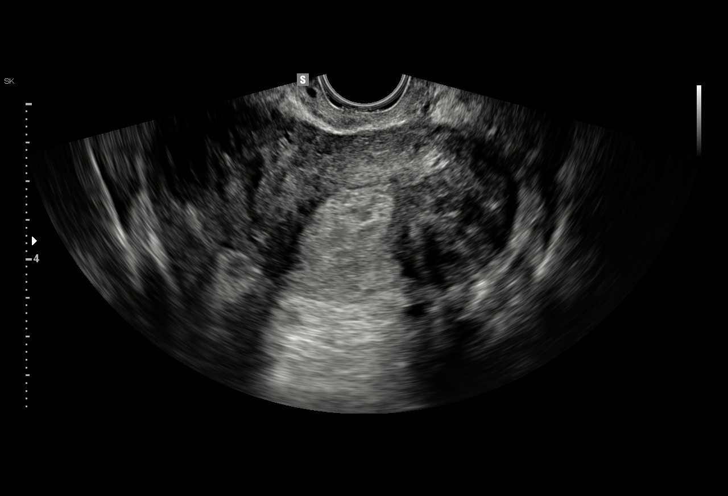
[im 48/62]
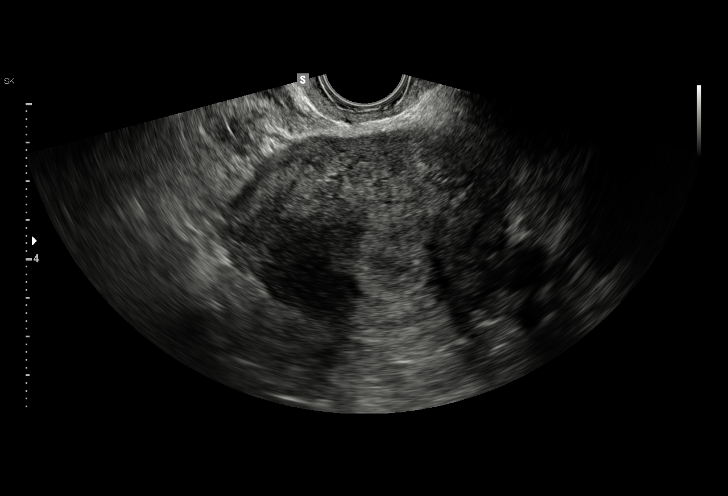
[im 52/62]
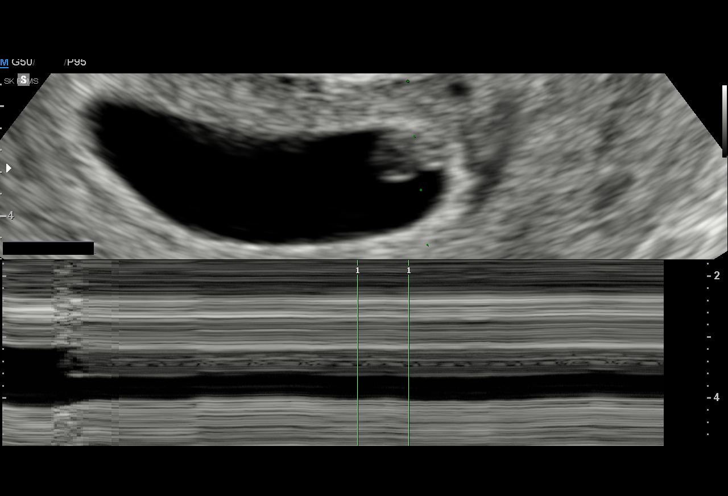
[im 57/62]
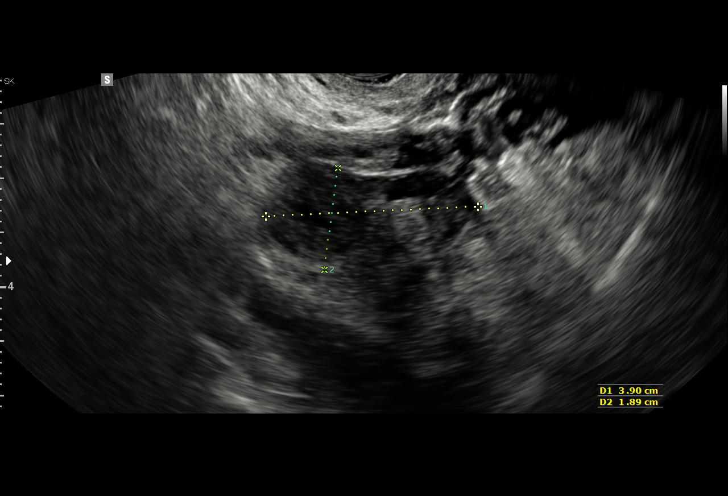
[im 62/62]
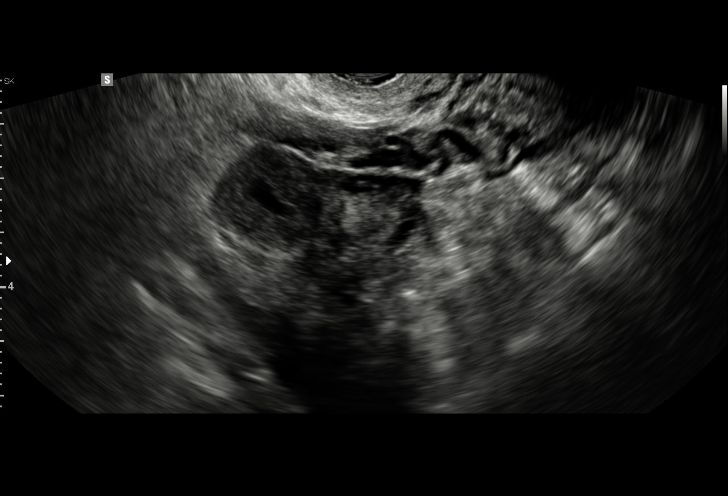

[15 of 28 positions shown; findings below may reference images not displayed]

FINDINGS: Intrauterine gestational sac: Present

Yolk sac:  Present

Embryo:  Present

Cardiac Activity: Present

Heart Rate: 120  bpm

CRL:  3  mm   5 w   6 d                  US EDC: 08/02/2016

Subchorionic hemorrhage: Present right of the gestational sac and
measuring 17 x 11 x 11 mm.

Maternal uterus/adnexae: Corpus luteum on the right. There is
heterogeneous predominately hypoechoic intramural mass in the left
uterus measuring up to 47 mm.
IMPRESSION: 1. Single living intrauterine pregnancy measuring 5 weeks 6 days.
2. 17 x 11 x 11 mm subchorionic hematoma.
3. 47 mm intramural fibroid that has enlarged since 7557 comparison.

## 2016-07-26 ENCOUNTER — Inpatient Hospital Stay (HOSPITAL_COMMUNITY): Payer: BLUE CROSS/BLUE SHIELD | Admitting: Anesthesiology

## 2016-07-26 ENCOUNTER — Inpatient Hospital Stay (HOSPITAL_COMMUNITY)
Admission: AD | Admit: 2016-07-26 | Discharge: 2016-07-28 | DRG: 775 | Disposition: A | Discharge status: Home / Self Care | Payer: BLUE CROSS/BLUE SHIELD | Source: Ambulatory Visit | Attending: Obstetrics and Gynecology | Admitting: Obstetrics and Gynecology

## 2016-07-26 ENCOUNTER — Encounter (HOSPITAL_COMMUNITY): Payer: Self-pay | Admitting: *Deleted

## 2016-07-26 DIAGNOSIS — O3413 Maternal care for benign tumor of corpus uteri, third trimester: Secondary | ICD-10-CM | POA: Diagnosis present

## 2016-07-26 DIAGNOSIS — O99824 Streptococcus B carrier state complicating childbirth: Secondary | ICD-10-CM | POA: Diagnosis present

## 2016-07-26 DIAGNOSIS — O4292 Full-term premature rupture of membranes, unspecified as to length of time between rupture and onset of labor: Secondary | ICD-10-CM | POA: Diagnosis present

## 2016-07-26 DIAGNOSIS — Z3A39 39 weeks gestation of pregnancy: Secondary | ICD-10-CM | POA: Diagnosis not present

## 2016-07-26 DIAGNOSIS — D259 Leiomyoma of uterus, unspecified: Secondary | ICD-10-CM | POA: Diagnosis present

## 2016-07-26 HISTORY — DX: Irritable bowel syndrome without diarrhea: K58.9

## 2016-07-26 LAB — TYPE AND SCREEN
ABO/RH(D): O POS
ANTIBODY SCREEN: NEGATIVE

## 2016-07-26 LAB — RPR: RPR: NONREACTIVE

## 2016-07-26 LAB — POCT FERN TEST: POCT Fern Test: POSITIVE

## 2016-07-26 LAB — CBC
HCT: 33.2 % — ABNORMAL LOW (ref 36.0–46.0)
Hemoglobin: 10.9 g/dL — ABNORMAL LOW (ref 12.0–15.0)
MCH: 25.2 pg — AB (ref 26.0–34.0)
MCHC: 32.8 g/dL (ref 30.0–36.0)
MCV: 76.7 fL — ABNORMAL LOW (ref 78.0–100.0)
Platelets: 224 10*3/uL (ref 150–400)
RBC: 4.33 MIL/uL (ref 3.87–5.11)
RDW: 15.7 % — ABNORMAL HIGH (ref 11.5–15.5)
WBC: 10.6 10*3/uL — AB (ref 4.0–10.5)

## 2016-07-26 MED ORDER — ONDANSETRON HCL 4 MG/2ML IJ SOLN: 4.0000 mg | Freq: Four times a day (QID) | INTRAMUSCULAR | Status: DC | PRN

## 2016-07-26 MED ORDER — FENTANYL CITRATE (PF) 100 MCG/2ML IJ SOLN
50.0000 ug | INTRAMUSCULAR | Status: DC | PRN
Start: 2016-07-26 — End: 2016-07-26
  Filled 2016-07-26: qty 2

## 2016-07-26 MED ORDER — LACTATED RINGERS IV SOLN: 500.0000 mL | Freq: Once | INTRAVENOUS | Status: DC

## 2016-07-26 MED ORDER — OXYCODONE HCL 5 MG PO TABS: 10.0000 mg | ORAL_TABLET | ORAL | Status: DC | PRN

## 2016-07-26 MED ORDER — PHENYLEPHRINE 40 MCG/ML (10ML) SYRINGE FOR IV PUSH (FOR BLOOD PRESSURE SUPPORT)
80.0000 ug | PREFILLED_SYRINGE | INTRAVENOUS | Status: DC | PRN
  Filled 2016-07-26: qty 5

## 2016-07-26 MED ORDER — ACETAMINOPHEN 325 MG PO TABS: 650.0000 mg | ORAL_TABLET | ORAL | Status: DC | PRN

## 2016-07-26 MED ORDER — SENNOSIDES-DOCUSATE SODIUM 8.6-50 MG PO TABS
2.0000 | ORAL_TABLET | ORAL | Status: DC
  Administered 2016-07-27 (×2): 2 via ORAL
  Filled 2016-07-26 (×2): qty 2

## 2016-07-26 MED ORDER — DIPHENHYDRAMINE HCL 50 MG/ML IJ SOLN: 12.5000 mg | INTRAMUSCULAR | Status: DC | PRN

## 2016-07-26 MED ORDER — SIMETHICONE 80 MG PO CHEW: 80.0000 mg | CHEWABLE_TABLET | ORAL | Status: DC | PRN

## 2016-07-26 MED ORDER — PRENATAL MULTIVITAMIN CH
1.0000 | ORAL_TABLET | Freq: Every day | ORAL | Status: DC
  Administered 2016-07-27 – 2016-07-28 (×2): 1 via ORAL
  Filled 2016-07-26 (×2): qty 1

## 2016-07-26 MED ORDER — LIDOCAINE HCL (PF) 1 % IJ SOLN
INTRAMUSCULAR | Status: DC | PRN
  Administered 2016-07-26: 4 mL via EPIDURAL

## 2016-07-26 MED ORDER — LACTATED RINGERS IV SOLN: 500.0000 mL | INTRAVENOUS | Status: DC | PRN

## 2016-07-26 MED ORDER — FLEET ENEMA 7-19 GM/118ML RE ENEM: 1.0000 | ENEMA | RECTAL | Status: DC | PRN

## 2016-07-26 MED ORDER — OXYTOCIN 40 UNITS IN LACTATED RINGERS INFUSION - SIMPLE MED
1.0000 m[IU]/min | INTRAVENOUS | Status: DC
  Administered 2016-07-26: 2 m[IU]/min via INTRAVENOUS
  Filled 2016-07-26: qty 1000

## 2016-07-26 MED ORDER — PHENYLEPHRINE 40 MCG/ML (10ML) SYRINGE FOR IV PUSH (FOR BLOOD PRESSURE SUPPORT)
80.0000 ug | PREFILLED_SYRINGE | INTRAVENOUS | Status: DC | PRN
  Filled 2016-07-26: qty 5
  Filled 2016-07-26: qty 10

## 2016-07-26 MED ORDER — FENTANYL CITRATE (PF) 100 MCG/2ML IJ SOLN
INTRAMUSCULAR | Status: AC
  Administered 2016-07-26: 100 ug
  Filled 2016-07-26: qty 2

## 2016-07-26 MED ORDER — OXYTOCIN 40 UNITS IN LACTATED RINGERS INFUSION - SIMPLE MED: 2.5000 [IU]/h | INTRAVENOUS | Status: DC

## 2016-07-26 MED ORDER — DIBUCAINE 1 % RE OINT: 1.0000 "application " | TOPICAL_OINTMENT | RECTAL | Status: DC | PRN

## 2016-07-26 MED ORDER — EPHEDRINE 5 MG/ML INJ
10.0000 mg | INTRAVENOUS | Status: DC | PRN
  Filled 2016-07-26: qty 4

## 2016-07-26 MED ORDER — WITCH HAZEL-GLYCERIN EX PADS: 1.0000 "application " | MEDICATED_PAD | CUTANEOUS | Status: DC | PRN

## 2016-07-26 MED ORDER — SOD CITRATE-CITRIC ACID 500-334 MG/5ML PO SOLN: 30.0000 mL | ORAL | Status: DC | PRN

## 2016-07-26 MED ORDER — LIDOCAINE HCL (PF) 1 % IJ SOLN
30.0000 mL | INTRAMUSCULAR | Status: DC | PRN
  Filled 2016-07-26: qty 30

## 2016-07-26 MED ORDER — TERBUTALINE SULFATE 1 MG/ML IJ SOLN
0.2500 mg | Freq: Once | INTRAMUSCULAR | Status: DC | PRN
  Filled 2016-07-26: qty 1

## 2016-07-26 MED ORDER — BENZOCAINE-MENTHOL 20-0.5 % EX AERO
1.0000 "application " | INHALATION_SPRAY | CUTANEOUS | Status: DC | PRN
  Filled 2016-07-26: qty 56

## 2016-07-26 MED ORDER — DIPHENHYDRAMINE HCL 25 MG PO CAPS: 25.0000 mg | ORAL_CAPSULE | Freq: Four times a day (QID) | ORAL | Status: DC | PRN

## 2016-07-26 MED ORDER — OXYTOCIN BOLUS FROM INFUSION
500.0000 mL | Freq: Once | INTRAVENOUS | Status: AC
  Administered 2016-07-26: 500 mL via INTRAVENOUS

## 2016-07-26 MED ORDER — ZOLPIDEM TARTRATE 5 MG PO TABS: 5.0000 mg | ORAL_TABLET | Freq: Every evening | ORAL | Status: DC | PRN

## 2016-07-26 MED ORDER — ONDANSETRON HCL 4 MG/2ML IJ SOLN: 4.0000 mg | INTRAMUSCULAR | Status: DC | PRN

## 2016-07-26 MED ORDER — OXYCODONE-ACETAMINOPHEN 5-325 MG PO TABS: 1.0000 | ORAL_TABLET | ORAL | Status: DC | PRN

## 2016-07-26 MED ORDER — FENTANYL 2.5 MCG/ML BUPIVACAINE 1/10 % EPIDURAL INFUSION (WH - ANES)
14.0000 mL/h | INTRAMUSCULAR | Status: DC | PRN
  Administered 2016-07-26 (×3): 14 mL/h via EPIDURAL
  Filled 2016-07-26 (×2): qty 100

## 2016-07-26 MED ORDER — OXYCODONE-ACETAMINOPHEN 5-325 MG PO TABS: 2.0000 | ORAL_TABLET | ORAL | Status: DC | PRN

## 2016-07-26 MED ORDER — VANCOMYCIN HCL IN DEXTROSE 1-5 GM/200ML-% IV SOLN
1000.0000 mg | Freq: Two times a day (BID) | INTRAVENOUS | Status: DC
Start: 2016-07-26 — End: 2016-07-26
  Administered 2016-07-26 (×2): 1000 mg via INTRAVENOUS
  Filled 2016-07-26 (×2): qty 200

## 2016-07-26 MED ORDER — LACTATED RINGERS IV SOLN
500.0000 mL | Freq: Once | INTRAVENOUS | Status: AC
  Administered 2016-07-26: 500 mL via INTRAVENOUS

## 2016-07-26 MED ORDER — TETANUS-DIPHTH-ACELL PERTUSSIS 5-2.5-18.5 LF-MCG/0.5 IM SUSP: 0.5000 mL | Freq: Once | INTRAMUSCULAR | Status: DC

## 2016-07-26 MED ORDER — LACTATED RINGERS IV SOLN
INTRAVENOUS | Status: DC
  Administered 2016-07-26 (×2): via INTRAVENOUS

## 2016-07-26 MED ORDER — ONDANSETRON HCL 4 MG PO TABS: 4.0000 mg | ORAL_TABLET | ORAL | Status: DC | PRN

## 2016-07-26 MED ORDER — COCONUT OIL OIL: 1.0000 "application " | TOPICAL_OIL | Status: DC | PRN

## 2016-07-26 MED ORDER — OXYCODONE HCL 5 MG PO TABS: 5.0000 mg | ORAL_TABLET | ORAL | Status: DC | PRN

## 2016-07-26 MED ORDER — IBUPROFEN 600 MG PO TABS
600.0000 mg | ORAL_TABLET | Freq: Four times a day (QID) | ORAL | Status: DC
  Administered 2016-07-26 – 2016-07-28 (×7): 600 mg via ORAL
  Filled 2016-07-26 (×7): qty 1

## 2016-07-26 NOTE — Progress Notes (Signed)
S:  Cofm w/cle.  O:  Vitals  Vitals:   07/26/16 1000 07/26/16 1030  BP: 128/82 122/81  Pulse: 95 84  Resp:  18  Temp:      Gen: NAD SVE: 5/90/-2 FHT: cat 1 Toco: irritable  A/P: Pt is a G1P0 @39 .0wga here w/SROM.  Labor: - s/p SROM - continue pitocin - h/o 7cm LUS fibroid, not thought to preclude vaginal delivery   FWB: - reassuring - cat 1 tracing  GBS + - cont vanc  Lucillie Garfinkel MD

## 2016-07-26 NOTE — Anesthesia Procedure Notes (Signed)
Epidural Patient location during procedure: OB Start time: 07/26/2016 5:02 AM End time: 07/26/2016 5:08 AM  Staffing Anesthesiologist: Suella Broad D Performed: anesthesiologist   Preanesthetic Checklist Completed: patient identified, site marked, surgical consent, pre-op evaluation, timeout performed, IV checked, risks and benefits discussed and monitors and equipment checked  Epidural Patient position: sitting Prep: ChloraPrep Patient monitoring: heart rate, continuous pulse ox and blood pressure Approach: midline Location: L3-L4 Injection technique: LOR saline  Needle:  Needle type: Tuohy  Needle gauge: 17 G Needle length: 9 cm Catheter type: closed end flexible Catheter size: 20 Guage Test dose: negative and 1.5% lidocaine  Assessment Events: blood not aspirated, injection not painful, no injection resistance and no paresthesia  Additional Notes LOR @ 6  Patient identified. Risks/Benefits/Options discussed with patient including but not limited to bleeding, infection, nerve damage, paralysis, failed block, incomplete pain control, headache, blood pressure changes, nausea, vomiting, reactions to medications, itching and postpartum back pain. Confirmed with bedside nurse the patient's most recent platelet count. Confirmed with patient that they are not currently taking any anticoagulation, have any bleeding history or any family history of bleeding disorders. Patient expressed understanding and wished to proceed. All questions were answered. Sterile technique was used throughout the entire procedure. Please see nursing notes for vital signs. Test dose was given through epidural catheter and negative prior to continuing to dose epidural or start infusion. Warning signs of high block given to the patient including shortness of breath, tingling/numbness in hands, complete motor block, or any concerning symptoms with instructions to call for help. Patient was given instructions on fall  risk and not to get out of bed. All questions and concerns addressed with instructions to call with any issues or inadequate analgesia.    Reason for block:procedure for pain

## 2016-07-26 NOTE — Anesthesia Preprocedure Evaluation (Signed)
Anesthesia Evaluation  Patient identified by MRN, date of birth, ID band Patient awake    Reviewed: Allergy & Precautions, Patient's Chart, lab work & pertinent test results  Airway Mallampati: II  TM Distance: >3 FB Neck ROM: Full    Dental  (+) Teeth Intact   Pulmonary asthma ,    breath sounds clear to auscultation       Cardiovascular negative cardio ROS   Rhythm:Regular Rate:Normal     Neuro/Psych negative neurological ROS  negative psych ROS   GI/Hepatic negative GI ROS, Neg liver ROS,   Endo/Other  negative endocrine ROS  Renal/GU negative Renal ROS  negative genitourinary   Musculoskeletal negative musculoskeletal ROS (+)   Abdominal   Peds negative pediatric ROS (+)  Hematology negative hematology ROS (+)   Anesthesia Other Findings   Reproductive/Obstetrics (+) Pregnancy                             Lab Results  Component Value Date   WBC 10.6 (H) 07/26/2016   HGB 10.9 (L) 07/26/2016   HCT 33.2 (L) 07/26/2016   MCV 76.7 (L) 07/26/2016   PLT 224 07/26/2016     Anesthesia Physical Anesthesia Plan  ASA: II  Anesthesia Plan: Epidural   Post-op Pain Management:    Induction:   Airway Management Planned:   Additional Equipment:   Intra-op Plan:   Post-operative Plan:   Informed Consent: I have reviewed the patients History and Physical, chart, labs and discussed the procedure including the risks, benefits and alternatives for the proposed anesthesia with the patient or authorized representative who has indicated his/her understanding and acceptance.     Plan Discussed with:   Anesthesia Plan Comments:         Anesthesia Quick Evaluation

## 2016-07-26 NOTE — H&P (Signed)
Kaitlin Salinas is a 36 y.o. female presenting for SROM. OB History    Gravida Para Term Preterm AB Living   1             SAB TAB Ectopic Multiple Live Births                 Past Medical History:  Diagnosis Date  . Asthma   . IBS (irritable bowel syndrome) 2016   Past Surgical History:  Procedure Laterality Date  . FRACTURE SURGERY     Family History: family history is not on file. Social History:  reports that she has never smoked. She has never used smokeless tobacco. She reports that she drinks alcohol. She reports that she uses drugs, including Marijuana.     Maternal Diabetes: No Genetic Screening: Normal Maternal Ultrasounds/Referrals: Normal Fetal Ultrasounds or other Referrals:  None Maternal Substance Abuse:  No Significant Maternal Medications:  None Significant Maternal Lab Results:  None Other Comments:  None  ROS History Dilation: 3 Effacement (%): 80 Station: -2 Exam by:: Leigh Aurora, RN Blood pressure 123/85, pulse (!) 114, temperature 98 F (36.7 C), temperature source Oral, resp. rate 20, height 5\' 8"  (1.727 m), weight 104.8 kg (231 lb), last menstrual period 10/20/2015, SpO2 100 %. Exam Physical Exam  Gen: NAD, A&O Cv: reg rate Pulm: NWOB Abd: soft, nontender, gravid  Prenatal labs: ABO, Rh: --/--/O POS (01/27 0250) Antibody: NEG (01/27 0250) Rubella:   RPR: Non Reactive (06/09 0147)  HBsAg:    HIV: Non Reactive (06/09 0147)  GBS: Positive (01/06 0000)   Assessment/Plan: 36 yo G4P0030 @ 39.0wga presenting w/SROM. + contractions but no cervical change from office. Initiate pitocin. CLE prn. H/o 7cm LUS fibroid thought to not preclude NSVD. GBS + - vanc as pt has PCN allrgy and GBS resistant to Clinda.    Tyson Dense 07/26/2016, 7:40 AM

## 2016-07-26 NOTE — Anesthesia Pain Management Evaluation Note (Signed)
  CRNA Pain Management Visit Note  Patient: Kaitlin Salinas, 36 y.o., female  "Hello I am a member of the anesthesia team at [facility]. We have an anesthesia team available at all times to provide care throughout the hospital, including epidural management and anesthesia for C-section. I don't know your plan for the delivery whether it a natural birth, water birth, IV sedation, nitrous supplementation, doula or epidural, but we want to meet your pain goals."   1.Was your pain managed to your expectations on prior hospitalizations?   No prior hospitalizations  2.What is your expectation for pain management during this hospitalization?     Epidural  3.How can we help you reach that goal? Support prn  Record the patient's initial score and the patient's pain goal.   Pain: 1  Pain Goal: 4 [facility] wants you to be able to say your pain was always managed very well.  [facility] 07/26/2016

## 2016-07-26 NOTE — MAU Note (Signed)
Pt reports her water broke at at 2am with ctx. Good fetal movement reported

## 2016-07-27 LAB — CBC
HCT: 29.3 % — ABNORMAL LOW (ref 36.0–46.0)
Hemoglobin: 9.9 g/dL — ABNORMAL LOW (ref 12.0–15.0)
MCH: 26.3 pg (ref 26.0–34.0)
MCHC: 33.8 g/dL (ref 30.0–36.0)
MCV: 77.7 fL — AB (ref 78.0–100.0)
PLATELETS: 184 10*3/uL (ref 150–400)
RBC: 3.77 MIL/uL — AB (ref 3.87–5.11)
RDW: 16 % — AB (ref 11.5–15.5)
WBC: 21.8 10*3/uL — ABNORMAL HIGH (ref 4.0–10.5)

## 2016-07-27 NOTE — Anesthesia Postprocedure Evaluation (Signed)
Anesthesia Post Note  Patient: Kaitlin Salinas  Procedure(s) Performed: * No procedures listed *  Patient location during evaluation: Mother Baby Anesthesia Type: Epidural Level of consciousness: awake and alert, oriented and patient cooperative Pain management: pain level controlled Vital Signs Assessment: post-procedure vital signs reviewed and stable Respiratory status: spontaneous breathing Cardiovascular status: stable Postop Assessment: no headache, epidural receding, patient able to bend at knees and no signs of nausea or vomiting Anesthetic complications: no Comments: Pain score 3.        Last Vitals:  Vitals:   07/27/16 0005 07/27/16 0700  BP: 126/74 (!) 104/58  Pulse: 75 74  Resp: 18 18  Temp: 37 C 36.9 C    Last Pain:  Vitals:   07/27/16 0700  TempSrc: Oral  PainSc:    Pain Goal:                 [facility]

## 2016-07-27 NOTE — Lactation Note (Signed)
This note was copied from a baby's chart. Lactation Consultation Note  P1, 21 hours old.  Mother states she is unable to hand express "anything". Baby recently received 15 ml of formula in bottle. Reviewed hand expression bilaterally and drops expressed. Assisted w/ latching and compressing breast in cross cradle on R side. A few sucks observed and baby fell asleep. Suggest breastfeeding before offering formula to help establish her milk supply. Reviewed supply, demand, cluster feeding and depth. Mom made aware of O/P services, breastfeeding support groups, community resources, and our phone # for post-discharge questions.  Pacifier use not recommended at this time.  Mom encouraged to feed baby 8-12 times/24 hours and with feeding cues.        Patient Name: Kaitlin Salinas [id number] Date: 07/27/2016 Reason for consult: Initial assessment   Maternal Data    Feeding Feeding Type: Breast Fed Length of feed: 5 min  LATCH Score/Interventions Latch: Repeated attempts needed to sustain latch, nipple held in mouth throughout feeding, stimulation needed to elicit sucking reflex.  Audible Swallowing: A few with stimulation Intervention(s): Hand expression  Type of Nipple: Everted at rest and after stimulation  Comfort (Breast/Nipple): Soft / non-tender     Hold (Positioning): Assistance needed to correctly position infant at breast and maintain latch.  LATCH Score: 7  Lactation Tools Discussed/Used     Consult Status Consult Status: Follow-up Date: 07/28/16 Follow-up type: In-patient    Vivianne Master [facility] 07/27/2016, 3:43 PM

## 2016-07-27 NOTE — Progress Notes (Signed)
Double light phototherapy started. Jaundice hand out given to pt.  Eye shields in place.  Pt and sig other instructed to keep infant under lights all the time and to be sure that eye shields remain in place.  Pt and sig other informed of time for next blood draw.  Pt questioned nurse, "does she have to stay under the lights all the time and what about changing the diaper?"  Pt inst contact nurse when diaper needed to be changed.  Pt verb understanding.

## 2016-07-27 NOTE — Progress Notes (Signed)
S: No complaints. Feeling well. Lochia appropriate. No subjective fevers/chills. Denies SOB, Cp, worsening swelling, RUQ pain, epigastric pain, HA and blurry vision.   O:  Vitals:   07/27/16 0005 07/27/16 0700  BP: 126/74 (!) 104/58  Pulse: 75 74  Resp: 18 18  Temp: 98.6 F (37 C) 98.4 F (36.9 C)    Gen: NAD, A&O Pulm: NWOB Abd: soft, appropriately ttp, fundus firm and below Umb Ext: No evidence of DVT, trace edema b/l  Labs CBC    Component Value Date/Time   WBC 21.8 (H) 07/27/2016 0519   RBC 3.77 (L) 07/27/2016 0519   HGB 9.9 (L) 07/27/2016 0519   HCT 29.3 (L) 07/27/2016 0519   PLT 184 07/27/2016 0519   MCV 77.7 (L) 07/27/2016 0519   MCH 26.3 07/27/2016 0519   MCHC 33.8 07/27/2016 0519   RDW 16.0 (H) 07/27/2016 0519    A/P:  PPD1 s/p NSVD, doing well pp. AFVSS. Benign exam.   # Postpartum: no issues  # Dispo: Continue present care. Plan for d/c PPD#2.   Lucillie Garfinkel MD

## 2016-07-27 NOTE — Progress Notes (Signed)
CSW met with MOB at bedside in attempt to complete assessment for consult regarding hx of anxiety/panic disorder. Upon this writers arrival and explanation of reason for visit, MOB notes she is unaware of ever being dx with anxiety. She further notes she does not suffer from it nor has she ever had a panic attack. This Probation officer informed MOB that her [facility] notes indicate that she has a hx of panic attacks/anxiety. MOB notes no recollection. Due to this, assessment was not complete as MOB felt she did not need one. At this time, no other needs addressed or requested. Case closed to this CSW.   Mellony Danziger, MSW, LCSW-A Clinical Social Worker  [facility]  Office: [phone]

## 2016-07-27 NOTE — Progress Notes (Signed)
Patient told plan of care for today. Patient states infant latches well but has been bottle feeding only recently. Will encourage latching.

## 2016-07-28 MED ORDER — IBUPROFEN 600 MG PO TABS: 600.0000 mg | ORAL_TABLET | Freq: Four times a day (QID) | ORAL | 1 refills | Status: AC

## 2016-07-28 NOTE — Discharge Summary (Signed)
Obstetric Discharge Summary Reason for Admission: rupture of membranes Prenatal Procedures: ultrasound Intrapartum Procedures: spontaneous vaginal delivery Postpartum Procedures: none Complications-Operative and Postpartum: 1 degree perineal laceration Hemoglobin  Date Value Ref Range Status  07/27/2016 9.9 (L) 12.0 - 15.0 g/dL Final   HCT  Date Value Ref Range Status  07/27/2016 29.3 (L) 36.0 - 46.0 % Final    Physical Exam:  General: alert and cooperative Lochia: appropriate Uterine Fundus: firm Incision: healing well DVT Evaluation: No evidence of DVT seen on physical exam. Negative Homan's sign. No cords or calf tenderness. Calf/Ankle edema is present.  Discharge Diagnoses: Term Pregnancy-delivered  Discharge Information: Date: 07/28/2016 Activity: pelvic rest Diet: routine Medications: PNV and Ibuprofen Condition: stable Instructions: refer to practice specific booklet Discharge to: home   Newborn Data: Live born female  Birth Weight: 7 lb 15.5 oz (3615 g) APGAR: 7, 9  Home with mother and baby is currently under phototherapy.  Kaitlin Salinas G 07/28/2016, 8:42 AM

## 2016-07-28 NOTE — Lactation Note (Signed)
This note was copied from a baby's chart. Lactation Consultation Note  Patient Name: Kaitlin Salinas [id number] Date: 07/28/2016 Reason for consult: Follow-up assessment;Hyperbilirubinemia Phototherapy started last night.  Mom states she puts baby to breast some but also formula feeds because milk is not in.  Reviewed supply and demand and encouraged to put baby to breast before giving formula.  Mom states baby latches easily.  Discussed pumping with DEBP every 3 hours to stimulate milk supply.  Symphony pump set up and initiated.  Instructed to post pump every 3 hours for 15 minutes.  Instructed to give any expressed milk back to baby.  Encouraged to call for assist/concerns.  Maternal Data    Feeding    LATCH Score/Interventions                      Lactation Tools Discussed/Used Pump Review: Setup, frequency, and cleaning;Milk Storage Initiated by:: [place] Date initiated:: 07/28/16   Consult Status Consult Status: Follow-up Date: 07/29/16 Follow-up type: In-patient    Ave Filter 07/28/2016, 10:54 AM

## 2016-07-29 ENCOUNTER — Ambulatory Visit: Payer: Self-pay

## 2016-07-29 NOTE — Lactation Note (Signed)
This note was copied from a baby's chart. Lactation Consultation Note  Patient Name: Kaitlin Salinas [id number] Date: 07/29/2016 Reason for consult: Follow-up assessment;Hyperbilirubinemia  NICU baby 29 hours old. Mom reports that she has not used DEBP today, and has been giving all formula. Discussed milk coming to volume and the need to stimulate breasts with each feeding to protect milk supply. Enc mom to put baby to breast first with each feeding, then supplement baby with EBM/formula, and then post-pump followed by hand expression. Mom reports that she is picking up her pump on the way home. Offered to assist with latch, but mom reports that the baby latches well, and that she has not been too sleepy too sleepy to latch. Discussed engorgement prevention/treatment, and milk coming to volume. Mom aware of OP/BFSG and [place] phone line assistance.   Maternal Data    Feeding Feeding Type: Bottle Fed - Formula Nipple Type: Slow - flow  LATCH Score/Interventions                      Lactation Tools Discussed/Used     Consult Status Consult Status: PRN    Andres Labrum 07/29/2016, 11:14 AM

## 2016-08-01 ENCOUNTER — Ambulatory Visit (HOSPITAL_COMMUNITY): Admission: RE | Admit: 2016-08-01 | Payer: BLUE CROSS/BLUE SHIELD | Source: Ambulatory Visit

## 2016-08-19 ENCOUNTER — Encounter: Payer: Self-pay | Admitting: Obstetrics and Gynecology

## 2022-11-19 ENCOUNTER — Other Ambulatory Visit: Payer: Self-pay | Admitting: Gerontology
